# Patient Record
Sex: Female | Born: 1990 | Race: Black or African American | Hispanic: No | Marital: Single | Smoking: Current every day smoker
Health system: Southern US, Community
[De-identification: ages and names within clinical notes are randomized; demographics above are authoritative.]

## PROBLEM LIST (undated history)

## (undated) ENCOUNTER — Inpatient Hospital Stay (HOSPITAL_COMMUNITY): Payer: Self-pay

## (undated) DIAGNOSIS — Z8619 Personal history of other infectious and parasitic diseases: Secondary | ICD-10-CM

## (undated) DIAGNOSIS — F419 Anxiety disorder, unspecified: Secondary | ICD-10-CM

## (undated) DIAGNOSIS — K319 Disease of stomach and duodenum, unspecified: Secondary | ICD-10-CM

## (undated) DIAGNOSIS — B999 Unspecified infectious disease: Secondary | ICD-10-CM

## (undated) DIAGNOSIS — F329 Major depressive disorder, single episode, unspecified: Secondary | ICD-10-CM

## (undated) DIAGNOSIS — B379 Candidiasis, unspecified: Secondary | ICD-10-CM

## (undated) DIAGNOSIS — D649 Anemia, unspecified: Secondary | ICD-10-CM

## (undated) DIAGNOSIS — J189 Pneumonia, unspecified organism: Secondary | ICD-10-CM

## (undated) HISTORY — DX: Unspecified infectious disease: B99.9

## (undated) HISTORY — DX: Personal history of other infectious and parasitic diseases: Z86.19

## (undated) HISTORY — DX: Disease of stomach and duodenum, unspecified: K31.9

## (undated) HISTORY — DX: Pneumonia, unspecified organism: J18.9

## (undated) HISTORY — DX: Candidiasis, unspecified: B37.9

## (undated) HISTORY — DX: Anxiety disorder, unspecified: F41.9

---
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
## 2010-05-09 LAB — POCT URINALYSIS DIP (DEVICE)
Hgb urine dipstick: NEGATIVE
Nitrite: NEGATIVE
Protein, ur: NEGATIVE mg/dL
Urobilinogen, UA: 1 mg/dL (ref 0.0–1.0)

## 2010-05-09 LAB — POCT PREGNANCY, URINE: Preg Test, Ur: NEGATIVE

## 2010-05-09 LAB — URINE CULTURE: Culture: NO GROWTH

## 2010-11-05 LAB — CBC
Hemoglobin: 12.6
MCV: 86.6
RBC: 4.34
WBC: 4.3 — ABNORMAL LOW

## 2010-11-05 LAB — URINALYSIS, ROUTINE W REFLEX MICROSCOPIC
Bilirubin Urine: NEGATIVE
Glucose, UA: NEGATIVE
Specific Gravity, Urine: 1.022

## 2010-11-05 LAB — URINE MICROSCOPIC-ADD ON

## 2010-11-05 LAB — COMPREHENSIVE METABOLIC PANEL
BUN: 5 — ABNORMAL LOW
Creatinine, Ser: 0.67
Glucose, Bld: 87

## 2010-11-05 LAB — POCT PREGNANCY, URINE: Preg Test, Ur: NEGATIVE

## 2010-11-05 LAB — DIFFERENTIAL
Eosinophils Relative: 1
Lymphs Abs: 1.6
Monocytes Absolute: 0.5

## 2011-02-04 DIAGNOSIS — B999 Unspecified infectious disease: Secondary | ICD-10-CM

## 2011-02-04 HISTORY — DX: Unspecified infectious disease: B99.9

## 2011-07-15 ENCOUNTER — Inpatient Hospital Stay (HOSPITAL_COMMUNITY): Payer: Medicaid Other

## 2011-07-15 ENCOUNTER — Encounter (HOSPITAL_COMMUNITY): Payer: Self-pay | Admitting: *Deleted

## 2011-07-15 ENCOUNTER — Inpatient Hospital Stay (HOSPITAL_COMMUNITY)
Admission: AD | Admit: 2011-07-15 | Discharge: 2011-07-15 | Disposition: A | Discharge status: Home / Self Care | Payer: Medicaid Other | Source: Ambulatory Visit | Attending: Family Medicine | Admitting: Family Medicine

## 2011-07-15 DIAGNOSIS — O219 Vomiting of pregnancy, unspecified: Secondary | ICD-10-CM

## 2011-07-15 DIAGNOSIS — R109 Unspecified abdominal pain: Secondary | ICD-10-CM | POA: Insufficient documentation

## 2011-07-15 DIAGNOSIS — O210 Mild hyperemesis gravidarum: Secondary | ICD-10-CM | POA: Insufficient documentation

## 2011-07-15 LAB — CBC
MCH: 29.4 pg (ref 26.0–34.0)
Platelets: 369 10*3/uL (ref 150–400)
RDW: 12.1 % (ref 11.5–15.5)
WBC: 11.7 10*3/uL — ABNORMAL HIGH (ref 4.0–10.5)

## 2011-07-15 LAB — URINALYSIS, ROUTINE W REFLEX MICROSCOPIC
Glucose, UA: NEGATIVE mg/dL
Hgb urine dipstick: NEGATIVE
Protein, ur: NEGATIVE mg/dL
Specific Gravity, Urine: 1.015 (ref 1.005–1.030)
pH: 6.5 (ref 5.0–8.0)

## 2011-07-15 LAB — HCG, QUANTITATIVE, PREGNANCY: hCG, Beta Chain, Quant, S: 99386 m[IU]/mL — ABNORMAL HIGH (ref <5)

## 2011-07-15 LAB — URINE MICROSCOPIC-ADD ON

## 2011-07-15 LAB — ABO/RH: ABO/RH(D): O POS

## 2011-07-15 LAB — WET PREP, GENITAL
Clue Cells Wet Prep HPF POC: NONE SEEN
Trich, Wet Prep: NONE SEEN
Yeast Wet Prep HPF POC: NONE SEEN

## 2011-07-15 LAB — POCT PREGNANCY, URINE: Preg Test, Ur: POSITIVE — AB

## 2011-07-15 MED ORDER — PROMETHAZINE HCL 12.5 MG PO TABS: 12.5000 mg | ORAL_TABLET | Freq: Four times a day (QID) | ORAL | Status: DC | PRN

## 2011-07-15 NOTE — MAU Note (Signed)
Almost 8wks preg.  Last night had some cramping. Vomited around 2100 last night.  Since then has had little sharp pains.

## 2011-07-15 NOTE — Discharge Instructions (Signed)
Morning Sickness Morning sickness is when you feel sick to your stomach (nauseous) during pregnancy. This nauseous feeling may or may not come with throwing up (vomiting). It often occurs in the morning, but can be a problem any time of day. While morning sickness is unpleasant, it is usually harmless unless you develop severe and continual vomiting (hyperemesis gravidarum). This condition requires more intense treatment. CAUSES  The cause of morning sickness is not completely known but seems to be related to a sudden increase of two hormones:   Human chorionic gonadotropin (hCG).   Estrogen hormone.  These are elevated in the first part of the pregnancy. TREATMENT  Do not use any medicines (prescription, over-the-counter, or herbal) for morning sickness without first talking to your caregiver. Some patients are helped by the following:  Vitamin B6 (25mg every 8 hours) or vitamin B6 shots.   An antihistamine called doxylamine (10mg every 8 hours).   The herbal medication ginger.  HOME CARE INSTRUCTIONS   Taking multivitamins before getting pregnant can prevent or decrease the severity of morning sickness in most women.   Eat a piece of dry toast or unsalted crackers before getting out of bed in the morning.   Eat 5 or 6 small meals a day.   Eat dry and bland foods (rice, baked potato).   Do not drink liquids with your meals. Drink liquids between meals.   Avoid greasy, fatty, and spicy foods.   Get someone to cook for you if the smell of any food causes nausea and vomiting.   Avoid vitamin pills with iron because iron can cause nausea.   Snack on protein foods between meals if you are hungry.   Eat unsweetened gelatins for deserts.   Wear an acupressure wristband (worn for sea sickness) may be helpful.   Acupuncture may be helpful.   Do not smoke.   Get a humidifier to keep the air in your house free of odors.  SEEK MEDICAL CARE IF:   Your home remedies are not working  and you need medication.   You feel dizzy or lightheaded.   You are losing weight.   You need help with your diet.  SEEK IMMEDIATE MEDICAL CARE IF:   You have persistent and uncontrolled nausea and vomiting.   You pass out (faint).   You have a fever.  MAKE SURE YOU:   Understand these instructions.   Will watch your condition.   Will get help right away if you are not doing well or get worse.  Document Released: 03/13/2006 Document Revised: 01/09/2011 Document Reviewed: 01/08/2007 [employer] Patient Information 2012 [employer]. 

## 2011-07-15 NOTE — MAU Provider Note (Signed)
Gardiner Sleeper y.o.G1P0 @[redacted]w[redacted]d  by LMP Chief Complaint  Patient presents with  . Abdominal Pain     First Provider Initiated Contact with Patient 07/15/11 1650      SUBJECTIVE  HPI: She describes crampy lower abdominal pain "like when you ovulate" that has been present for one day. She's also had some sharp pains in her upper abdomen since she vomited past night. No vaginal bleeding. Has applied for [facility] and plans to get care at [facility].  Past Medical History  Diagnosis Date  . No pertinent past medical history    Past Surgical History  Procedure Date  . Tonsillectomy   . Wisdom tooth extraction 2007   History   Social History  . Marital Status: Married    Spouse Name: N/A    Number of Children: N/A  . Years of Education: N/A   Occupational History  . Not on file.   Social History Main Topics  . Smoking status: Former Games developer  . Smokeless tobacco: Not on file  . Alcohol Use: No     social alcohol before pregnancy  . Drug Use: No  . Sexually Active: Yes   Other Topics Concern  . Not on file   Social History Narrative  . No narrative on file   No current facility-administered medications on file prior to encounter.   No current outpatient prescriptions on file prior to encounter.   No Known Allergies  ROS: Pertinent items in HPI  OBJECTIVE Blood pressure 104/66, pulse 79, temperature 98.7 F (37.1 C), temperature source Oral, resp. rate 18, height 5' 1.5" (1.562 m), weight 50.803 kg (112 lb), last menstrual period 05/23/2011.  GENERAL: Well-developed, well-nourished female in no acute distress.  HEENT: Normocephalic, good dentition HEART: normal rate RESP: normal effort ABDOMEN: Soft, nontender EXTREMITIES: Nontender, no edema NEURO: Alert and oriented SPECULUM EXAM: NEFG, thick whitedischarge, no blood noted, cervix clean BIMANUAL: cervixlong, closed; uterus 6-8 wk size; no adnexal tenderness or masses   LAB RESULTS  Results for orders placed during  the hospital encounter of 07/15/11 (from the past 24 hour(s))  URINALYSIS, ROUTINE W REFLEX MICROSCOPIC     Status: Abnormal   Collection Time   07/15/11  4:00 PM      Component Value Range   Color, Urine YELLOW  YELLOW    APPearance CLEAR  CLEAR    Specific Gravity, Urine 1.015  1.005 - 1.030    pH 6.5  5.0 - 8.0    Glucose, UA NEGATIVE  NEGATIVE (mg/dL)   Hgb urine dipstick NEGATIVE  NEGATIVE    Bilirubin Urine NEGATIVE  NEGATIVE    Ketones, ur NEGATIVE  NEGATIVE (mg/dL)   Protein, ur NEGATIVE  NEGATIVE (mg/dL)   Urobilinogen, UA 0.2  0.0 - 1.0 (mg/dL)   Nitrite NEGATIVE  NEGATIVE    Leukocytes, UA SMALL (*) NEGATIVE   URINE MICROSCOPIC-ADD ON     Status: Abnormal   Collection Time   07/15/11  4:00 PM      Component Value Range   Squamous Epithelial / LPF MANY (*) RARE    WBC, UA 3-6  <3 (WBC/hpf)   RBC / HPF 0-2  <3 (RBC/hpf)   Bacteria, UA MANY (*) RARE    Urine-Other MUCOUS PRESENT    POCT PREGNANCY, URINE     Status: Abnormal   Collection Time   07/15/11  4:06 PM      Component Value Range   Preg Test, Ur POSITIVE (*) NEGATIVE     IMAGING  Viable IUP [redacted]w[redacted]d  ASSESSMENT Nausea vomiting of pregnancy, without dehydration  PLAN  Rx Phenergan, PNVs Keep appointment at [facility] for pregnancy care     Kai Railsback 07/15/2011 4:56 PM

## 2011-07-16 LAB — GC/CHLAMYDIA PROBE AMP, GENITAL: GC Probe Amp, Genital: NEGATIVE

## 2011-07-16 NOTE — MAU Provider Note (Signed)
Chart reviewed and agree with management and plan.  

## 2011-09-03 ENCOUNTER — Ambulatory Visit (INDEPENDENT_AMBULATORY_CARE_PROVIDER_SITE_OTHER): Payer: Medicaid Other | Admitting: Obstetrics and Gynecology

## 2011-09-03 DIAGNOSIS — Z331 Pregnant state, incidental: Secondary | ICD-10-CM

## 2011-09-03 LAB — POCT URINALYSIS DIPSTICK
Blood, UA: NEGATIVE
Glucose, UA: NEGATIVE
Nitrite, UA: NEGATIVE

## 2011-09-04 ENCOUNTER — Ambulatory Visit (INDEPENDENT_AMBULATORY_CARE_PROVIDER_SITE_OTHER): Payer: Medicaid Other | Admitting: Obstetrics and Gynecology

## 2011-09-04 ENCOUNTER — Encounter: Payer: Self-pay | Admitting: Obstetrics and Gynecology

## 2011-09-04 VITALS — BP 110/70 | Wt 129.0 lb

## 2011-09-04 DIAGNOSIS — R110 Nausea: Secondary | ICD-10-CM

## 2011-09-04 DIAGNOSIS — D649 Anemia, unspecified: Secondary | ICD-10-CM | POA: Insufficient documentation

## 2011-09-04 DIAGNOSIS — Z331 Pregnant state, incidental: Secondary | ICD-10-CM

## 2011-09-04 LAB — PRENATAL PANEL VII
Antibody Screen: NEGATIVE
Basophils Absolute: 0 10*3/uL (ref 0.0–0.1)
HCT: 31.3 % — ABNORMAL LOW (ref 36.0–46.0)
HIV: NONREACTIVE
Hemoglobin: 10.7 g/dL — ABNORMAL LOW (ref 12.0–15.0)
Lymphocytes Relative: 18 % (ref 12–46)
MCV: 87.2 fL (ref 78.0–100.0)
Neutro Abs: 9.8 10*3/uL — ABNORMAL HIGH (ref 1.7–7.7)
RDW: 13.9 % (ref 11.5–15.5)
Rh Type: POSITIVE

## 2011-09-04 LAB — POCT WET PREP (WET MOUNT)
Clue Cells Wet Prep Whiff POC: NEGATIVE
WBC, Wet Prep HPF POC: NEGATIVE

## 2011-09-04 MED ORDER — ONDANSETRON 4 MG PO TBDP: 4.0000 mg | ORAL_TABLET | Freq: Three times a day (TID) | ORAL | Status: AC | PRN

## 2011-09-04 NOTE — Progress Notes (Signed)
[redacted]w[redacted]d Subjective:    Diane Kent is being seen today for her first obstetrical visit at [redacted]w[redacted]d gestation by USS EDD 02/27/11.   She reports intermittent tiredness.  Her obstetrical history is significant for: There is no problem list on file for this patient.   Relationship with FOB:  Involved and supportive.  Patient does intend to breast feed.   Pregnancy history fully reviewed.    Review of Systems Pertinent ROS is described in HPI   Objective:   BP 110/70  Wt 129 lb (58.514 kg)  LMP 05/23/2011 Wt Readings from Last 1 Encounters:  09/04/11 129 lb (58.514 kg)   BMI: 20.82  General: alert, cooperative and no distress Respiratory: clear to auscultation bilaterally Cardiovascular: regular rate and rhythm, S1, S2 normal, no murmur Breasts:  No dominant masses, nipples erect Gastrointestinal: soft, non-tender; no masses,  no organomegaly Extremities: extremities normal, no pain or edema Vaginal Bleeding: None  EXTERNAL GENITALIA: normal appearing vulva with no masses, tenderness or lesions VAGINA: no abnormal discharge or lesions CERVIX: no lesions or cervical motion tenderness; cervix closed, long, firm UTERUS: gravid and consistent with [redacted]w[redacted]d weeks.   ADNEXA: no masses palpable and nontender OB EXAM PELVIMETRY: appears adequate   FHR:  145 bpm  Assessment:    Pregnancy at [redacted]w[redacted]d   Plan:     Prenatal panel reviewed and discussed with the patient:yes  Advised re anemia. Patient states that she has a hx of anemia. Pap smear collected:yes GC/Chlamydia collected:yes Wet prep:  Ph 4.0 - neg Discussion of Genetic testing options: would like to do Quad screen Prenatal vitamins recommended - taking same. Problem list reviewed and updated.  Plan of care: Follow up in 4 weeks for Anatomy USS - to schedule  Earl Gala CNM, [place] 09/04/2011 6:20 PM

## 2011-09-04 NOTE — Progress Notes (Signed)
Pap done feb/march 2013 wnl  Per pt.  No complaints.

## 2011-09-05 LAB — CULTURE, OB URINE: Colony Count: NO GROWTH

## 2011-09-08 LAB — PAP IG, CT-NG, RFX HPV ASCU

## 2011-10-02 ENCOUNTER — Other Ambulatory Visit: Payer: Self-pay | Admitting: Obstetrics and Gynecology

## 2011-10-02 ENCOUNTER — Encounter: Payer: Self-pay | Admitting: Obstetrics and Gynecology

## 2011-10-02 ENCOUNTER — Ambulatory Visit (INDEPENDENT_AMBULATORY_CARE_PROVIDER_SITE_OTHER): Payer: Medicaid Other

## 2011-10-02 ENCOUNTER — Ambulatory Visit (INDEPENDENT_AMBULATORY_CARE_PROVIDER_SITE_OTHER): Payer: Medicaid Other | Admitting: Obstetrics and Gynecology

## 2011-10-02 VITALS — BP 90/68 | Wt 136.0 lb

## 2011-10-02 DIAGNOSIS — Z331 Pregnant state, incidental: Secondary | ICD-10-CM

## 2011-10-02 DIAGNOSIS — Z3689 Encounter for other specified antenatal screening: Secondary | ICD-10-CM

## 2011-10-02 DIAGNOSIS — Z3490 Encounter for supervision of normal pregnancy, unspecified, unspecified trimester: Secondary | ICD-10-CM

## 2011-10-02 DIAGNOSIS — Z1379 Encounter for other screening for genetic and chromosomal anomalies: Secondary | ICD-10-CM

## 2011-10-02 LAB — US OB COMP + 14 WK

## 2011-10-02 NOTE — Addendum Note (Signed)
Addended by: Janeece Agee on: 10/02/2011 10:24 AM   Modules accepted: Orders

## 2011-10-02 NOTE — Progress Notes (Signed)
Doing well. [place] today--see CMA note for details.  EDC c/w dates Quad screen today.

## 2011-10-02 NOTE — Progress Notes (Signed)
WNL anatomy u/s today  Excited it's a girl! EFW 10 oz cx 3.79 Anterior placenta. Placenta edge to cx WNL's  AP pocket = 4.3 cm

## 2011-10-06 ENCOUNTER — Telehealth: Payer: Self-pay | Admitting: Obstetrics and Gynecology

## 2011-10-06 NOTE — Telephone Encounter (Signed)
Last bm 9/1. Discussed nutrition, low fat diet without fast food, encouraged clear liquids x 24 hours, OTC antidiarrhea meds prn, report if symptoms change, contiue, or unresolved. Lavera Guise, CNM

## 2011-10-07 ENCOUNTER — Telehealth: Payer: Self-pay

## 2011-10-07 DIAGNOSIS — Z331 Pregnant state, incidental: Secondary | ICD-10-CM

## 2011-10-07 LAB — AFP, QUAD SCREEN
Age Alone: 1:1160 {titer}
HCG, Total: 19485 m[IU]/mL
MoM for AFP: 0.9
MoM for hCG: 1.07
Open Spina bifida: NEGATIVE
Tri 18 Scr Risk Est: NEGATIVE
uE3 Mom: 0.83
uE3 Value: 0.9 ng/mL

## 2011-10-07 MED ORDER — VITAFOL-ONE 29-1-200 MG PO CAPS: 1.0000 | ORAL_CAPSULE | Freq: Every day | ORAL | Status: DC

## 2011-10-07 NOTE — Telephone Encounter (Signed)
Called pt to inform her rx she requested re: VitaFol One DHA have been sent to [facility]. Pt agrees and voices understands.

## 2011-10-07 NOTE — Telephone Encounter (Deleted)
Message copied by Janeece Agee on Tue Oct 07, 2011 12:00 PM ------      Message from: Cornelius Moras      Created: Fri Oct 03, 2011 12:21 PM      Regarding: PNV need       Patient wants "Vita Fol" PNV--can't order in EPIC.      Would you please, pretty please, with sugar on top, call this in for #30, refills x 1 year to the patient's desired pharmacy???            Love you, mean it!            VL

## 2011-10-30 ENCOUNTER — Encounter: Payer: Self-pay | Admitting: Obstetrics and Gynecology

## 2011-10-30 ENCOUNTER — Ambulatory Visit (INDEPENDENT_AMBULATORY_CARE_PROVIDER_SITE_OTHER): Payer: Medicaid Other | Admitting: Obstetrics and Gynecology

## 2011-10-30 VITALS — BP 104/60 | Wt 144.0 lb

## 2011-10-30 DIAGNOSIS — Z331 Pregnant state, incidental: Secondary | ICD-10-CM

## 2011-10-30 NOTE — Progress Notes (Signed)
Patient ID: Diane Kent, female   DOB: 1990/10/24, 21 y.o.   MRN: [id number] [redacted]w[redacted]d F/o 1 gtt Discussed common discomforts of pg, excessive weight gain, nutrition. Avoid sauces, gravy, sweet tea, soda, fried foods, limit eating out and watch food choices and portion sizes. No more than 1/2 cup juice daily, better to eat fruit then drink juice. Increase fiber in diet, fresh rather than processed foods. Get protein throught meals and snacks to include meat, eggs, beans, nuts skim an fat free dairy: milk, cheese, yougart, 8 glasses of water daily. Exercise discussed. Lavera Guise, CNM

## 2011-10-30 NOTE — Progress Notes (Signed)
[redacted]w[redacted]d No concerns per pt  WNL quad screen

## 2011-11-07 ENCOUNTER — Inpatient Hospital Stay (HOSPITAL_COMMUNITY)
Admission: AD | Admit: 2011-11-07 | Discharge: 2011-11-07 | Disposition: A | Discharge status: Home / Self Care | Payer: Medicaid Other | Source: Ambulatory Visit | Attending: Obstetrics and Gynecology | Admitting: Obstetrics and Gynecology

## 2011-11-07 ENCOUNTER — Telehealth: Payer: Self-pay | Admitting: Obstetrics and Gynecology

## 2011-11-07 ENCOUNTER — Encounter (HOSPITAL_COMMUNITY): Payer: Self-pay | Admitting: Obstetrics and Gynecology

## 2011-11-07 DIAGNOSIS — O4700 False labor before 37 completed weeks of gestation, unspecified trimester: Secondary | ICD-10-CM

## 2011-11-07 LAB — URINALYSIS, ROUTINE W REFLEX MICROSCOPIC
Bilirubin Urine: NEGATIVE
Glucose, UA: 100 mg/dL — AB
Hgb urine dipstick: NEGATIVE
Ketones, ur: NEGATIVE mg/dL
Protein, ur: NEGATIVE mg/dL

## 2011-11-07 LAB — WET PREP, GENITAL

## 2011-11-07 LAB — FETAL FIBRONECTIN: Fetal Fibronectin: NEGATIVE

## 2011-11-07 MED ORDER — LACTATED RINGERS IV BOLUS (SEPSIS)
500.0000 mL | Freq: Once | INTRAVENOUS | Status: AC
Start: 2011-11-07 — End: 2011-11-07
  Administered 2011-11-07: 500 mL via INTRAVENOUS

## 2011-11-07 MED ORDER — IBUPROFEN 800 MG PO TABS
800.0000 mg | ORAL_TABLET | Freq: Once | ORAL | Status: AC
  Administered 2011-11-07: 800 mg via ORAL
  Filled 2011-11-07: qty 1

## 2011-11-07 MED ORDER — IBUPROFEN 800 MG PO TABS: 800.0000 mg | ORAL_TABLET | Freq: Three times a day (TID) | ORAL | Status: DC | PRN

## 2011-11-07 NOTE — Telephone Encounter (Signed)
TC from pt. States the past few days has had increasing  of contractions sometimes 3-4/hr which are more painful than previously.  Has increased water but no change. Also has increased mucousy vaginal D/C with wiping. +FM  Continues to have lightheadedness even though eating more frequently.  Per DD pt to MAU. Pt verbalizes comprehension.

## 2011-11-07 NOTE — MAU Note (Signed)
Low abd pains - been getting worse since Wed, abd gets hard.  Has a discharge, yellowish tint- is a thick mucous.

## 2011-11-07 NOTE — MAU Provider Note (Signed)
History   This patient called the office with complaint of preterm contractions and change in vaginal discharge.  Reports intercourse 2 nights ago.  No VB or LOF.  GFM.  No UTI or PIH s/s.  Pt not in school and unemployed.    CSN: [id number]  Arrival date and time: 11/07/11 1518   First Provider Initiated Contact with Patient 11/07/11 1900      Chief Complaint  Patient presents with  . Labor Eval   HPI  OB History    Grav Para Term Preterm Abortions TAB SAB Ect Mult Living   1 0 0 0 0 0 0 0 0 0       Past Medical History  Diagnosis Date  . Pneumonia     AS CHILD  . Stomach disease 2009    NO MEDS CURRENTLY  . Anxiety 2009    ON MEDS X 1 YEAR  . Infection 02/2011    UTI  . Yeast infection   . H/O bacterial infection   . History of chlamydia     Past Surgical History  Procedure Date  . Wisdom tooth extraction 2007  . Tonsillectomy 2009    T&A    Family History  Problem Relation Age of Onset  . Lupus Maternal Aunt   . Hypertension Paternal Aunt   . Fibromyalgia Paternal Aunt   . Mental illness Paternal Uncle   . Hypertension Paternal Uncle   . Arthritis Maternal Grandmother   . Hyperlipidemia Maternal Grandmother   . Hypertension Maternal Grandmother   . Hyperlipidemia Paternal Grandmother   . Hypertension Paternal Grandmother   . Diabetes Paternal Grandfather   . Hyperlipidemia Paternal Grandfather   . Hypertension Paternal Grandfather   . Asthma Other     History  Substance Use Topics  . Smoking status: Former Smoker -- 1.0 packs/day    Quit date: 06/28/2011  . Smokeless tobacco: Never Used  . Alcohol Use: No     social alcohol before pregnancy    Allergies: No Known Allergies  Prescriptions prior to admission  Medication Sig Dispense Refill  . calcium carbonate (TUMS EX) 750 MG chewable tablet Chew 1 tablet by mouth daily. heartburn      . ondansetron (ZOFRAN-ODT) 8 MG disintegrating tablet Take 8 mg by mouth every 8 (eight) hours as needed.       . Prenatal Vit-FePoly-FA-DHA (VITAFOL-ONE) 29-1-200 MG CAPS Take 1 capsule by mouth daily.  30 capsule  11  . DISCONTD: Prenatal Vit-Fe Fumarate-FA (PRENATAL MULTIVITAMIN) TABS Take 1 tablet by mouth daily.      . promethazine (PHENERGAN) 12.5 MG tablet Take 1 tablet (12.5 mg total) by mouth every 6 (six) hours as needed for nausea.  30 tablet  0    ROS--negative for all systems Physical Exam   Blood pressure 124/77, pulse 104, temperature 98.1 F (36.7 C), temperature source Oral, resp. rate 20, height 5\' 2"  (1.575 m), weight 148 lb (67.132 kg), last menstrual period 05/23/2011.  Physical Exam Constitutional: She is oriented to person, place, and time. She appears well-developed and well-nourished.  HENT:  Head: Normocephalic and atraumatic.  Eyes: Conjunctivae normal and EOM are normal. Pupils are equal, round, and reactive to light.  Neck: Normal range of motion. Neck supple.  Cardiovascular: Normal rate, regular rhythm and normal heart sounds.  Respiratory: Effort normal and breath sounds normal.  GI: Soft. Bowel sounds are normal.  Genitourinary: Uterus normal. Vaginal discharge found.  Preterm contractions Creamy, yellow discharge.  Musculoskeletal: Normal range of  motion.  Neurological: She is alert and oriented to person, place, and time. She has normal reflexes.  Skin: Skin is warm and dry.  Psychiatric: She has a normal mood and affect.   .. Results for orders placed during the hospital encounter of 11/07/11 (from the past 24 hour(s))  URINALYSIS, ROUTINE W REFLEX MICROSCOPIC     Status: Abnormal   Collection Time   11/07/11  3:47 PM      Component Value Range   Color, Urine YELLOW  YELLOW   APPearance CLEAR  CLEAR   Specific Gravity, Urine >1.030 (*) 1.005 - 1.030   pH 6.0  5.0 - 8.0   Glucose, UA 100 (*) NEGATIVE mg/dL   Hgb urine dipstick NEGATIVE  NEGATIVE   Bilirubin Urine NEGATIVE  NEGATIVE   Ketones, ur NEGATIVE  NEGATIVE mg/dL   Protein, ur NEGATIVE   NEGATIVE mg/dL   Urobilinogen, UA 0.2  0.0 - 1.0 mg/dL   Nitrite NEGATIVE  NEGATIVE   Leukocytes, UA NEGATIVE  NEGATIVE  WET PREP, GENITAL     Status: Abnormal   Collection Time   11/07/11  6:40 PM      Component Value Range   Yeast Wet Prep HPF POC NONE SEEN  NONE SEEN   Trich, Wet Prep NONE SEEN  NONE SEEN   Clue Cells Wet Prep HPF POC NONE SEEN  NONE SEEN   WBC, Wet Prep HPF POC FEW (*) NONE SEEN  FETAL FIBRONECTIN     Status: Normal   Collection Time   11/07/11  6:40 PM      Component Value Range   Fetal Fibronectin NEGATIVE  NEGATIVE  EFM: 140, appropriate for GA TOCO; occ'l ctx w/ some UI, spaced after IV hydration  MAU Course  Procedures Gc/ct FFN Wet prep U/a One liter LR Motrin 800mg  po x1 Assessment and Plan  1.  [redacted]w[redacted]d 2.  Preterm contractions w/ some spacing after IV hydration 3.  Inadequate po hydration today 3.  No s/s of infection 4.  FFN negative & cx closed  1.  D/c'd home after interventions w/ PTL precautions 2.  Motrin Rx given to continue x24hrs every 8 hrs po; reiterated adequate water intake 3.  F/u 10/24, or prn.  Will have office call pt first of week to check on her; no strenuous activity this weekend and pelvic rest for at least one week  Diane Kent H 11/07/2011, 8:45 PM

## 2011-11-08 LAB — GC/CHLAMYDIA PROBE AMP, GENITAL: GC Probe Amp, Genital: NEGATIVE

## 2011-11-24 ENCOUNTER — Telehealth: Payer: Self-pay | Admitting: Obstetrics and Gynecology

## 2011-11-24 NOTE — Telephone Encounter (Signed)
TC to pt. States has had nasal congestion , sore throat, headaches, body aches and cough x 1 week. T-nl. Has tried Robitussin and Tylenol with no improvement. Also states felt FM this AM.  None this afternoon after eating lunch and drinking GAtorade. Per VL, advised pt to try Delsym for her cough and Sudafed for congestion.  To continue rest and increased fluids. Also per VL, pt to eat, drink sweet fluid and monitor FM.   To call VL by 7:00 Pm with update. Instructions given to reach after hours. Pt verbalizes comprehension.

## 2011-11-25 ENCOUNTER — Telehealth: Payer: Self-pay | Admitting: Obstetrics and Gynecology

## 2011-11-25 NOTE — Telephone Encounter (Signed)
VM received from "general delivery."   States was supposed to call regarding FM and baby has been moving more.   TC to pt.   States unable to reach midwife last PM. Baby started moving after eating last PM.  Advised to call if unsure of decreased FM or no improvement in cold sx.  Pt verbalizes comprehension.

## 2011-11-27 ENCOUNTER — Other Ambulatory Visit: Payer: Medicaid Other

## 2011-11-27 ENCOUNTER — Ambulatory Visit (INDEPENDENT_AMBULATORY_CARE_PROVIDER_SITE_OTHER): Payer: Medicaid Other | Admitting: Obstetrics and Gynecology

## 2011-11-27 ENCOUNTER — Encounter: Payer: Self-pay | Admitting: Obstetrics and Gynecology

## 2011-11-27 VITALS — BP 92/60 | Temp 98.0°F | Wt 143.0 lb

## 2011-11-27 DIAGNOSIS — H6690 Otitis media, unspecified, unspecified ear: Secondary | ICD-10-CM

## 2011-11-27 DIAGNOSIS — Z331 Pregnant state, incidental: Secondary | ICD-10-CM

## 2011-11-27 MED ORDER — AZITHROMYCIN 250 MG PO TABS: ORAL_TABLET | ORAL | Status: DC

## 2011-11-27 NOTE — Progress Notes (Signed)
[redacted]w[redacted]d Pt c/o L side ear pain since this am  Pt c/o cold sx's / WIll get flu vaccine once cold sx's has subsided  Pt has Peds Dr. Tally Joe 1 GTT given today Pt plans for Nexplanon Birth classes brochure given

## 2011-11-27 NOTE — Patient Instructions (Signed)
Fetal Movement Counts Patient Name: __________________________________________________ Patient Due Date: ____________________ Kick counts is highly recommended in high risk pregnancies, but it is a good idea for every pregnant woman to do. Start counting fetal movements at 28 weeks of the pregnancy. Fetal movements increase after eating a full meal or eating or drinking something sweet (the blood sugar is higher). It is also important to drink plenty of fluids (well hydrated) before doing the count. Lie on your left side because it helps with the circulation or you can sit in a comfortable chair with your arms over your belly (abdomen) with no distractions around you. DOING THE COUNT  Try to do the count the same time of day each time you do it.  Mark the day and time, then see how long it takes for you to feel 10 movements (kicks, flutters, swishes, rolls). You should have at least 10 movements within 2 hours. You will most likely feel 10 movements in much less than 2 hours. If you do not, wait an hour and count again. After a couple of days you will see a pattern.  What you are looking for is a change in the pattern or not enough counts in 2 hours. Is it taking longer in time to reach 10 movements? SEEK MEDICAL CARE IF:  You feel less than 10 counts in 2 hours. Tried twice.  No movement in one hour.  The pattern is changing or taking longer each day to reach 10 counts in 2 hours.  You feel the baby is not moving as it usually does. Date: ____________ Movements: ____________ Start time: ____________ Finish time: ____________  Date: ____________ Movements: ____________ Start time: ____________ Finish time: ____________ Date: ____________ Movements: ____________ Start time: ____________ Finish time: ____________ Date: ____________ Movements: ____________ Start time: ____________ Finish time: ____________ Date: ____________ Movements: ____________ Start time: ____________ Finish time:  ____________ Date: ____________ Movements: ____________ Start time: ____________ Finish time: ____________ Date: ____________ Movements: ____________ Start time: ____________ Finish time: ____________ Date: ____________ Movements: ____________ Start time: ____________ Finish time: ____________  Date: ____________ Movements: ____________ Start time: ____________ Finish time: ____________ Date: ____________ Movements: ____________ Start time: ____________ Finish time: ____________ Date: ____________ Movements: ____________ Start time: ____________ Finish time: ____________ Date: ____________ Movements: ____________ Start time: ____________ Finish time: ____________ Date: ____________ Movements: ____________ Start time: ____________ Finish time: ____________ Date: ____________ Movements: ____________ Start time: ____________ Finish time: ____________ Date: ____________ Movements: ____________ Start time: ____________ Finish time: ____________  Date: ____________ Movements: ____________ Start time: ____________ Finish time: ____________ Date: ____________ Movements: ____________ Start time: ____________ Finish time: ____________ Date: ____________ Movements: ____________ Start time: ____________ Finish time: ____________ Date: ____________ Movements: ____________ Start time: ____________ Finish time: ____________ Date: ____________ Movements: ____________ Start time: ____________ Finish time: ____________ Date: ____________ Movements: ____________ Start time: ____________ Finish time: ____________ Date: ____________ Movements: ____________ Start time: ____________ Finish time: ____________  Date: ____________ Movements: ____________ Start time: ____________ Finish time: ____________ Date: ____________ Movements: ____________ Start time: ____________ Finish time: ____________ Date: ____________ Movements: ____________ Start time: ____________ Finish time: ____________ Date: ____________ Movements:  ____________ Start time: ____________ Finish time: ____________ Date: ____________ Movements: ____________ Start time: ____________ Finish time: ____________ Date: ____________ Movements: ____________ Start time: ____________ Finish time: ____________ Date: ____________ Movements: ____________ Start time: ____________ Finish time: ____________  Date: ____________ Movements: ____________ Start time: ____________ Finish time: ____________ Date: ____________ Movements: ____________ Start time: ____________ Finish time: ____________ Date: ____________ Movements: ____________ Start time: ____________ Finish time: ____________ Date: ____________ Movements:   ____________ Start time: ____________ Doreatha Martin time: ____________ Date: ____________ Movements: ____________ Start time: ____________ Doreatha Martin time: ____________ Date: ____________ Movements: ____________ Start time: ____________ Doreatha Martin time: ____________ Date: ____________ Movements: ____________ Start time: ____________ Doreatha Martin time: ____________  Date: ____________ Movements: ____________ Start time: ____________ Doreatha Martin time: ____________ Date: ____________ Movements: ____________ Start time: ____________ Doreatha Martin time: ____________ Date: ____________ Movements: ____________ Start time: ____________ Doreatha Martin time: ____________ Date: ____________ Movements: ____________ Start time: ____________ Doreatha Martin time: ____________ Date: ____________ Movements: ____________ Start time: ____________ Doreatha Martin time: ____________ Date: ____________ Movements: ____________ Start time: ____________ Doreatha Martin time: ____________ Date: ____________ Movements: ____________ Start time: ____________ Doreatha Martin time: ____________  Date: ____________ Movements: ____________ Start time: ____________ Doreatha Martin time: ____________ Date: ____________ Movements: ____________ Start time: ____________ Doreatha Martin time: ____________ Date: ____________ Movements: ____________ Start time: ____________ Doreatha Martin  time: ____________ Date: ____________ Movements: ____________ Start time: ____________ Doreatha Martin time: ____________ Date: ____________ Movements: ____________ Start time: ____________ Doreatha Martin time: ____________ Date: ____________ Movements: ____________ Start time: ____________ Doreatha Martin time: ____________ Date: ____________ Movements: ____________ Start time: ____________ Doreatha Martin time: ____________  Date: ____________ Movements: ____________ Start time: ____________ Doreatha Martin time: ____________ Date: ____________ Movements: ____________ Start time: ____________ Doreatha Martin time: ____________ Date: ____________ Movements: ____________ Start time: ____________ Doreatha Martin time: ____________ Date: ____________ Movements: ____________ Start time: ____________ Doreatha Martin time: ____________ Date: ____________ Movements: ____________ Start time: ____________ Doreatha Martin time: ____________ Date: ____________ Movements: ____________ Start time: ____________ Doreatha Martin time: ____________ Document Released: 02/19/2006 Document Revised: 04/14/2011 Document Reviewed: 08/22/2008 [employer] Patient Information 2013 [employer].   Preterm Birth Preterm birth is a birth which happens before 37 weeks of pregnancy. Most pregnancies last about 39 to 41 weeks. Every week in the womb is important and is beneficial to the health of the infant. Infants born before 37 weeks of pregnancy are at a higher risk for complications. Depending on when the infant was born, he or she may be:  Late preterm. Born between 32 and 37 weeks of pregnancy.  Very preterm. Born at less than 32 weeks of pregnancy.  Extremely preterm. Born at less than 25 weeks of pregnancy. The earlier a baby is born, the more likely the child will have issues related to prematurity. Complications and problems that can be seen in infants born too early include:  Problems breathing (respiratory distress syndrome).  Low birth weight.  Problems feeding.  Sleeping  problems.  Yellowing of the skin (jaundice).  Infections such as pneumonia. Babies that are born very preterm or extremely preterm are at risk for more serious medical issues. These include:  Breathing issues.  Eyesight issues.  Brain development issues (intraventricular hemorrhage).  Behavioral and emotional development issues.  Growth and developmental delays.  Cerebral palsy.  Serious feeding or bowel complications (necrotizing enterocolitis). CAUSES  There are 2 broad categories of preterm birth.  Spontaneous preterm birth. This is a birth resulting from preterm labor (not medically induced) or preterm premature rupture of membranes (PPROM).  Indicated preterm birth. This is a birth resulting from labor being medically induced due to health, personal, or social reasons. Preterm birth may be related to certain medical conditions, lifestyle factors, or demographic factors encountered by the mother or fetus.   Medical conditions include:  Multiple gestations (twins, triplets, and so on).  Infection.  Diabetes.  Heart disease.  Kidney disease.  Cervical or uterine abnormalities.  Being underweight.  High blood pressure or preeclampsia.  Premature rupture of membranes (PROM).  Birth defects in the fetus.  Lifestyle factors include:  Poor prenatal care.  Poor nutrition or  anemia.  Cigarette smoking.  Consuming alcohol.  High levels of stress and lack of social or emotional support.  Exposure to chemical or environmental toxins.  Substance abuse.  Demographic factors include:  African-American ethnicity.  Age (younger than 69 or older than 31).  Low socioeconomic status. Women with a history of preterm labor or who become pregnant within 13 months of giving birth are also at increased risk for preterm birth. DIAGNOSIS  Your caregiver may request additional tests to diagnose underlying complications resulting from preterm birth. Tests on the  infant may include:  Physical exam.  Blood tests.  Chest X-rays.  Heart-lung monitoring. PREVENTION There are some things you can do to help lower your risk of having a preterm infant in the future. These include:  Good prenatal care throughout the entire pregnancy. See a caregiver regularly for advice and tests.  Management of underlying medical conditions.  Proper self-care and lifestyle changes.  Proper diet and weight control.  Watching for signs of various infections. TREATMENT  After birth, special care will be taken to assess any problems or complications of the infant. Supportive care will be provided for the infant. Treatment depends on what problems are present and any complications that develop. Some preterm infants are cared for in a neonatal intensive care unit. In general, care may include:  Maintaining temperature and oxygen in a clear heated box (baby isolette).  Monitoring the infant's heart rate, breathing, and level of oxygen in the blood.  Monitoring for signs of infection and, if needed, giving intravenous (IV) antibiotic medicine.  Inserting a feeding tube (nose, mouth) or giving IV nutrition if unable to feed.  Inserting a breathing tube (ventilation).  Respiration support (continuous positive airway pressure [CPAP] or oxygen). Treatment will change as the infant builds up strength and is able to breathe and eat on his or her own. For some infants, no special treatment is necessary. Parents may be educated on the potential health risks of prematurity to the infant. HOME CARE INSTRUCTIONS  Understand your infant's special conditions and needs. It may be reassuring to learn about infant CPR.  Monitor your infant in the car seat until he or she grows and matures. Infant car seats can cause breathing difficulties for preterm infants.  Keep your infant warm. Dress your infant in layers and keep him or her away from drafts, especially in cold months of the  year.  Wash your hands thoroughly after going to the bathroom or changing a diaper. Late preterm infants may be more prone to infection.  Follow all your caregiver's instructions for providing support and care to your preterm infant.  Get support from organizations and groups that understand your challenges.  Follow up with your infant's caregiver as directed. SEEK MEDICAL CARE IF:  Your infant has feeding difficulties.  Your infant has sleeping difficulties.  Your infant has breathing difficulties.  Your infant's skin starts to look yellow (jaundice).  Your infant shows signs of infection like a stuffy nose, fever, crying, or bluish color of the skin. FOR MORE INFORMATION [employer]: www.marchofdimes.com Prematurity.org: www.prematurity.org Document Released: 04/12/2003 Document Revised: 04/14/2011 Document Reviewed: 06/14/2009 [facility] Patient Information 2013 [place], [place].   Otitis Media, Adult A middle ear infection is an infection in the space behind the eardrum. The medical name for this is "otitis media." It may happen after a common cold. It is caused by a germ that starts growing in that space. You may feel swollen glands in your neck on the  side of the ear infection. HOME CARE INSTRUCTIONS   Take your medicine as directed until it is gone, even if you feel better after the first few days.  Only take over-the-counter or prescription medicines for pain, discomfort, or fever as directed by your caregiver.  Occasional use of a nasal decongestant a couple times per day may help with discomfort and help the eustachian tube to drain better. Follow up with your caregiver in 10 to 14 days or as directed, to be certain that the infection has cleared. Not keeping the appointment could result in a chronic or permanent injury, pain, hearing loss and disability. If there is any problem keeping the appointment, you must call back to this facility for assistance. SEEK IMMEDIATE  MEDICAL CARE IF:   You are not getting better in 2 to 3 days.  You have pain that is not controlled with medication.  You feel worse instead of better.  You cannot use the medication as directed.  You develop swelling, redness or pain around the ear or stiffness in your neck. MAKE SURE YOU:   Understand these instructions.  Will watch your condition.  Will get help right away if you are not doing well or get worse. Document Released: 10/26/2003 Document Revised: 04/14/2011 Document Reviewed: 08/27/2007 [employer] Patient Information 2013 [place], [place].

## 2011-11-27 NOTE — Progress Notes (Signed)
[redacted]w[redacted]d Head congestion and cough x1wk No fever, aches or chills, no N/V,  Woke up today w L earache  Seen at [facility] on 10-4 for PTC Denies ctx or cramping now Took BF classes rec CBE  1hr gtt today  Nasal turbinates erythematous and edematous  Throat clear R ear TM red L ear TM red, slightly swolen  URI   Rx zpack rec sudafed, benadryl, mucinex netti pot, nasal spray  Contact prim care or urgent care if sx's worsen or persistent fever  rv'd preterm labor [facility] RTO 2wks

## 2011-11-28 LAB — RPR

## 2011-12-12 ENCOUNTER — Ambulatory Visit (INDEPENDENT_AMBULATORY_CARE_PROVIDER_SITE_OTHER): Payer: Medicaid Other | Admitting: Obstetrics and Gynecology

## 2011-12-12 VITALS — BP 100/62 | Wt 159.0 lb

## 2011-12-12 DIAGNOSIS — Z3490 Encounter for supervision of normal pregnancy, unspecified, unspecified trimester: Secondary | ICD-10-CM

## 2011-12-12 DIAGNOSIS — Z331 Pregnant state, incidental: Secondary | ICD-10-CM

## 2011-12-12 DIAGNOSIS — Z23 Encounter for immunization: Secondary | ICD-10-CM

## 2011-12-12 NOTE — Progress Notes (Signed)
[redacted]w[redacted]d Office Visit on 11/27/2011  Component Date Value Range Status  . RPR 11/27/2011 NON REAC  NON REAC Final  . Glucose, 1 Hour GTT 11/27/2011 99  70 - 140 mg/dL Final  . Hemoglobin 16/11/9602 10.8* 12.0 - 15.0 g/dL Final   No complaints Several questions answered FKCs RTO 2wks Discussed wt

## 2011-12-13 ENCOUNTER — Encounter (HOSPITAL_COMMUNITY): Payer: Self-pay | Admitting: Obstetrics and Gynecology

## 2011-12-13 ENCOUNTER — Inpatient Hospital Stay (HOSPITAL_COMMUNITY)
Admission: AD | Admit: 2011-12-13 | Discharge: 2011-12-13 | Disposition: A | Discharge status: Home / Self Care | Payer: Medicaid Other | Source: Ambulatory Visit | Attending: Obstetrics and Gynecology | Admitting: Obstetrics and Gynecology

## 2011-12-13 ENCOUNTER — Telehealth: Payer: Self-pay | Admitting: Obstetrics and Gynecology

## 2011-12-13 DIAGNOSIS — O4690 Antepartum hemorrhage, unspecified, unspecified trimester: Secondary | ICD-10-CM | POA: Insufficient documentation

## 2011-12-13 DIAGNOSIS — O368190 Decreased fetal movements, unspecified trimester, not applicable or unspecified: Secondary | ICD-10-CM | POA: Insufficient documentation

## 2011-12-13 DIAGNOSIS — R109 Unspecified abdominal pain: Secondary | ICD-10-CM | POA: Insufficient documentation

## 2011-12-13 DIAGNOSIS — N930 Postcoital and contact bleeding: Secondary | ICD-10-CM

## 2011-12-13 LAB — URINALYSIS, ROUTINE W REFLEX MICROSCOPIC
Bilirubin Urine: NEGATIVE
Glucose, UA: NEGATIVE mg/dL
Hgb urine dipstick: NEGATIVE
Ketones, ur: NEGATIVE mg/dL
Protein, ur: NEGATIVE mg/dL

## 2011-12-13 NOTE — [facility]U Note (Signed)
Patient states she had spotting after intercourse last night, but none since that time. Has been having lower abdominal pressure and reports less fetal movement than usual.

## 2011-12-13 NOTE — Telephone Encounter (Signed)
Discussed comfort measures warm bath with baking soda, tylenol. Reviewed s/s preterm labor, srom, vag bleeding,daily kick counts to report, encouraged 8 water daily and frequent voids. Lavera Guise, CNM

## 2011-12-13 NOTE — Progress Notes (Signed)
C/o of decreased fm, vag bleeding with wiping this am x 1 after intercourse none since, now with +FM, no srom, no uc See prior telephone call O VSS      fhts reactive NST 140s      uc rare      Vag not assessed A [redacted]w[redacted]d     Reassuring FHTS P discharge. Reviewed s/s preterm labor, srom, vag bleeding,daily kick counts to report, encouraged 8 water daily and frequent voids. Lavera Guise, CNM

## 2011-12-25 ENCOUNTER — Ambulatory Visit (INDEPENDENT_AMBULATORY_CARE_PROVIDER_SITE_OTHER): Payer: Medicaid Other | Admitting: Obstetrics and Gynecology

## 2011-12-25 VITALS — BP 110/60 | Wt 163.0 lb

## 2011-12-25 DIAGNOSIS — Z331 Pregnant state, incidental: Secondary | ICD-10-CM

## 2011-12-25 NOTE — Progress Notes (Signed)
[redacted]w[redacted]d Doing well. Glucola equals 99.  Hemoglobin 10.8.  RPR nonreactive. Iron therapy and nutrition discussed. Car seat, childbirth classes, infant CPR, pediatrician discussed. Return office in 2 weeks. Dr. Stefano Gaul

## 2011-12-25 NOTE — Progress Notes (Signed)
[redacted]w[redacted]d No complaints today. 

## 2011-12-26 MED ORDER — VITAFOL-ONE 29-1-200 MG PO CAPS: 1.0000 | ORAL_CAPSULE | Freq: Every day | ORAL | Status: DC

## 2011-12-26 NOTE — Addendum Note (Signed)
Addended by: Janine Limbo on: 12/26/2011 06:18 PM   Modules accepted: Orders

## 2012-01-04 ENCOUNTER — Telehealth (HOSPITAL_COMMUNITY): Payer: Self-pay | Admitting: Obstetrics and Gynecology

## 2012-01-04 NOTE — Telephone Encounter (Signed)
C/O constipation with no BM for approx 7 days.  Has not treated with any meds.  C/O abd bloating and increased gas.  Only med at present is PNV.  Active fetus.  Denies UCs, ROM or bldg.  Rec fleets enema for immediate relief but pt states she is OOT on an Child psychotherapist and all pharmacies are closed.  Rec hot beverage for tonight.  Rec stool softeners, increased fiber, increased activity and Miralax or Senokot as needed.  F/U as sched for OB visit on 01/06/12.

## 2012-01-06 ENCOUNTER — Encounter: Payer: Self-pay | Admitting: Obstetrics and Gynecology

## 2012-01-06 ENCOUNTER — Ambulatory Visit (INDEPENDENT_AMBULATORY_CARE_PROVIDER_SITE_OTHER): Payer: Medicaid Other | Admitting: Obstetrics and Gynecology

## 2012-01-06 VITALS — BP 94/50 | Wt 164.0 lb

## 2012-01-06 DIAGNOSIS — Z331 Pregnant state, incidental: Secondary | ICD-10-CM

## 2012-01-06 NOTE — Progress Notes (Signed)
[redacted]w[redacted]d Pt has no complaints  

## 2012-01-06 NOTE — Progress Notes (Signed)
[redacted]w[redacted]d No complaints

## 2012-01-15 ENCOUNTER — Other Ambulatory Visit: Payer: Self-pay | Admitting: Obstetrics and Gynecology

## 2012-01-15 ENCOUNTER — Telehealth: Payer: Self-pay | Admitting: Obstetrics and Gynecology

## 2012-01-15 NOTE — Telephone Encounter (Signed)
TC from patient--34 weeks, G1P0, contracting q 5-7 min for 2 hours. Has pushed fluids and rested.  Some contractions are moderate. Denies leaking or bleeding, reports +FM. No hx PTL or any issues.  Come to MAU.

## 2012-01-16 ENCOUNTER — Observation Stay (HOSPITAL_COMMUNITY): Payer: Medicaid Other

## 2012-01-16 ENCOUNTER — Encounter (HOSPITAL_COMMUNITY): Payer: Self-pay | Admitting: *Deleted

## 2012-01-16 ENCOUNTER — Observation Stay (HOSPITAL_COMMUNITY)
Admission: AD | Admit: 2012-01-16 | Discharge: 2012-01-17 | Disposition: A | Discharge status: Home / Self Care | Payer: Medicaid Other | Source: Ambulatory Visit | Attending: Obstetrics and Gynecology | Admitting: Obstetrics and Gynecology

## 2012-01-16 DIAGNOSIS — O4700 False labor before 37 completed weeks of gestation, unspecified trimester: Principal | ICD-10-CM | POA: Insufficient documentation

## 2012-01-16 DIAGNOSIS — O99019 Anemia complicating pregnancy, unspecified trimester: Secondary | ICD-10-CM | POA: Insufficient documentation

## 2012-01-16 DIAGNOSIS — D649 Anemia, unspecified: Secondary | ICD-10-CM | POA: Insufficient documentation

## 2012-01-16 DIAGNOSIS — D72829 Elevated white blood cell count, unspecified: Secondary | ICD-10-CM | POA: Insufficient documentation

## 2012-01-16 LAB — DIFFERENTIAL
Basophils Absolute: 0 10*3/uL (ref 0.0–0.1)
Eosinophils Relative: 0 % (ref 0–5)
Lymphocytes Relative: 11 % — ABNORMAL LOW (ref 12–46)
Lymphs Abs: 2.1 10*3/uL (ref 0.7–4.0)
Neutro Abs: 15.3 10*3/uL — ABNORMAL HIGH (ref 1.7–7.7)

## 2012-01-16 LAB — URINALYSIS, ROUTINE W REFLEX MICROSCOPIC
Leukocytes, UA: NEGATIVE
Protein, ur: NEGATIVE mg/dL
Urobilinogen, UA: 0.2 mg/dL (ref 0.0–1.0)

## 2012-01-16 LAB — CBC
MCHC: 33.1 g/dL (ref 30.0–36.0)
MCV: 87.3 fL (ref 78.0–100.0)
Platelets: 421 10*3/uL — ABNORMAL HIGH (ref 150–400)
RDW: 12.9 % (ref 11.5–15.5)
WBC: 18.8 10*3/uL — ABNORMAL HIGH (ref 4.0–10.5)

## 2012-01-16 LAB — GC/CHLAMYDIA PROBE AMP
CT Probe RNA: NEGATIVE
GC Probe RNA: NEGATIVE

## 2012-01-16 LAB — WET PREP, GENITAL: Yeast Wet Prep HPF POC: NONE SEEN

## 2012-01-16 LAB — OB RESULTS CONSOLE GBS: GBS: NEGATIVE

## 2012-01-16 MED ORDER — BETAMETHASONE SOD PHOS & ACET 6 (3-3) MG/ML IJ SUSP
12.0000 mg | Freq: Once | INTRAMUSCULAR | Status: AC
  Administered 2012-01-16: 12 mg via INTRAMUSCULAR
  Filled 2012-01-16: qty 2

## 2012-01-16 MED ORDER — NIFEDIPINE 10 MG PO CAPS
10.0000 mg | ORAL_CAPSULE | ORAL | Status: AC | PRN
  Administered 2012-01-16 (×4): 10 mg via ORAL
  Filled 2012-01-16 (×4): qty 1

## 2012-01-16 MED ORDER — ACETAMINOPHEN 325 MG PO TABS
650.0000 mg | ORAL_TABLET | ORAL | Status: DC | PRN
  Administered 2012-01-16 (×2): 650 mg via ORAL
  Filled 2012-01-16 (×2): qty 2

## 2012-01-16 MED ORDER — DOCUSATE SODIUM 100 MG PO CAPS
100.0000 mg | ORAL_CAPSULE | Freq: Every day | ORAL | Status: DC
  Administered 2012-01-16 – 2012-01-17 (×2): 100 mg via ORAL
  Filled 2012-01-16 (×2): qty 1

## 2012-01-16 MED ORDER — CALCIUM CARBONATE ANTACID 500 MG PO CHEW: 2.0000 | CHEWABLE_TABLET | ORAL | Status: DC | PRN

## 2012-01-16 MED ORDER — BETAMETHASONE SOD PHOS & ACET 6 (3-3) MG/ML IJ SUSP: 12.0000 mg | INTRAMUSCULAR | Status: DC

## 2012-01-16 MED ORDER — BETAMETHASONE SOD PHOS & ACET 6 (3-3) MG/ML IJ SUSP
12.0000 mg | INTRAMUSCULAR | Status: DC
  Administered 2012-01-16: 12 mg via INTRAMUSCULAR
  Filled 2012-01-16 (×2): qty 2

## 2012-01-16 MED ORDER — TERBUTALINE SULFATE 1 MG/ML IJ SOLN
0.2500 mg | INTRAMUSCULAR | Status: AC
  Administered 2012-01-16: 0.25 mg via SUBCUTANEOUS
  Filled 2012-01-16: qty 1

## 2012-01-16 MED ORDER — LACTATED RINGERS IV SOLN
INTRAVENOUS | Status: DC
  Administered 2012-01-16 – 2012-01-17 (×5): via INTRAVENOUS

## 2012-01-16 MED ORDER — ZOLPIDEM TARTRATE 5 MG PO TABS
5.0000 mg | ORAL_TABLET | Freq: Every evening | ORAL | Status: DC | PRN
  Administered 2012-01-16: 5 mg via ORAL

## 2012-01-16 MED ORDER — LACTATED RINGERS IV BOLUS (SEPSIS)
300.0000 mL | Freq: Once | INTRAVENOUS | Status: AC
  Administered 2012-01-16: 1000 mL via INTRAVENOUS

## 2012-01-16 MED ORDER — PRENATAL MULTIVITAMIN CH
1.0000 | ORAL_TABLET | Freq: Every day | ORAL | Status: DC
  Administered 2012-01-16 – 2012-01-17 (×2): 1 via ORAL
  Filled 2012-01-16 (×2): qty 1

## 2012-01-16 NOTE — H&P (Signed)
Diane Kent is a 21 y.o. female, G1P0 at 69 weeks, admitted from [facility]U due to persistent UCs today and cervical change--initially 2 cm, 75%; then 3 cm, 80%, despite repeated doses of Procardia for total of 40 mg. Received initial dose of Betamethasone in [facility]U, with cultures, GBS, wet prep, UA done and culture sent.  Had IC earlier in the afternoon.  Patient Active Problem List  Diagnosis  . Anemia  . Premature labor   History of present pregnancy: Patient entered care at 14 weeks.   EDC of 02/27/12 was established by normal LMP and was in agreement with [place] at 7 5/7 weeks in [facility]U Anatomy scan:  189 6/7 weeks, with normal findings and an anterior placenta.   Additional [place] evaluations:  None Significant prenatal events:  Episode of vaginal bleeding at 29 weeks after IC. URI treated at 26 weeks with Zpak Last evaluation:  12/3 at office, no issues  Maternal Medical History:  Contractions: Onset was yesterday.      OB History    Grav Para Term Preterm Abortions TAB SAB Ect Mult Living   1 0 0 0 0 0 0 0 0 0      Past Medical History  Diagnosis Date  . Pneumonia     AS CHILD  . Stomach disease 2009    NO MEDS CURRENTLY  . Anxiety 2009    ON MEDS X 1 YEAR  . Infection 02/2011    UTI  . Yeast infection   . H/O bacterial infection   . History of chlamydia    Past Surgical History  Procedure Date  . Wisdom tooth extraction 2007  . Tonsillectomy 2009    T&A   Family History: family history includes Arthritis in her maternal grandmother; Asthma in her other; Diabetes in her paternal grandfather; Fibromyalgia in her paternal aunt; Hyperlipidemia in her maternal grandmother, paternal grandfather, and paternal grandmother; Hypertension in her maternal grandmother, paternal aunt, paternal grandfather, paternal grandmother, and paternal uncle; Lupus in her maternal aunt; and Mental illness in her paternal uncle.  Social History:  reports that she quit smoking about 6 months ago. She has  never used smokeless tobacco. She reports that she does not drink alcohol or use illicit drugs. Mother is present with her today, FOB is not present.  Patient is unemployed.   Prenatal Transfer Tool  Maternal Diabetes: No Genetic Screening: Normal Quad screen Maternal Ultrasounds/Referrals: Normal Fetal Ultrasounds or other Referrals:  None Maternal Substance Abuse:  No Significant Maternal Medications:  None Significant Maternal Lab Results:  None--GBS and cultures pending from today Other Comments:  34 weeks with PTL--1st dose betamethasone at 1:23am 01/16/12.  ROS:  Contractions, +FM.  Dilation: 2.5 Effacement (%): 80 Blood pressure 118/85, pulse 79, temperature 97.7 F (36.5 C), temperature source Oral, resp. rate 18, last menstrual period 05/23/2011.  Exam Physical Exam  Chest clear Heart RRR without murmur Abd gravid, NT Pelvic--initially cx 2 cm, 75%, vtx, -1 on arrival.  Re-check at 3:20am 3 cm, 80%, vtx, -1, with vtx presentation verified by BS [place]. Ext WNL  FHR reactive, no decels UCs q 3-4 min persistently, despite total of 40 mg Procardia in MAU  Results for orders placed during the hospital encounter of 01/16/12 (from the past 24 hour(s))  URINALYSIS, ROUTINE W REFLEX MICROSCOPIC     Status: Abnormal   Collection Time   01/16/12 12:30 AM      Component Value Range   Color, Urine YELLOW  YELLOW   APPearance  CLEAR  CLEAR   Specific Gravity, Urine <1.005 (*) 1.005 - 1.030   pH 7.0  5.0 - 8.0   Glucose, UA NEGATIVE  NEGATIVE mg/dL   Hgb urine dipstick NEGATIVE  NEGATIVE   Bilirubin Urine NEGATIVE  NEGATIVE   Ketones, ur NEGATIVE  NEGATIVE mg/dL   Protein, ur NEGATIVE  NEGATIVE mg/dL   Urobilinogen, UA 0.2  0.0 - 1.0 mg/dL   Nitrite NEGATIVE  NEGATIVE   Leukocytes, UA NEGATIVE  NEGATIVE  WET PREP, GENITAL     Status: Abnormal   Collection Time   01/16/12  1:00 AM      Component Value Range   Yeast Wet Prep HPF POC NONE SEEN  NONE SEEN   Trich, Wet Prep NONE  SEEN  NONE SEEN   Clue Cells Wet Prep HPF POC NONE SEEN  NONE SEEN   WBC, Wet Prep HPF POC FEW (*) NONE SEEN     Prenatal labs: ABO, Rh: O/POS/-- (07/31 1028) Antibody: NEG (07/31 1028) Rubella: 22.3 (07/31 1028) RPR: NON REAC (10/24 1031)  HBsAg: NEGATIVE (07/31 1028)  HIV: NON REACTIVE (07/31 1028)  GBS:   Pending from [facility]U today Cultures negative at NOB, pending from [facility]U today Glucola WNL Hgb 12 at NOB, 10.8 at 28 weeks Pap WNL in Feb/March 2013  Assessment/Plan: IUP at 34 weeks PTL, unresponsive to po Procardia Leukocytosis, likely due to PTL  Plan: Admit to Antenatal per consult with Dr. Su Hilt Routine [facility] Antenatal orders Complete betamethasone course Continue IV hydration. Give single dose Terbutaline now. [place] for growth and fluid. GBS and cultures pending. Repeat CBC 01/17/12.  Araceli Coufal 01/16/2012, 4:03 AM

## 2012-01-16 NOTE — Progress Notes (Signed)
Pt states she was treated for depression in high school in "09

## 2012-01-16 NOTE — MAU Provider Note (Signed)
History   21 yo G1P0 at 34 weeks presented c/o contractions since 8pm.  Did have sex earlier tonight, denies bleeding or leaking.  Reports +FM, denies dysuria, discharge, or any other symptom.    Patient Active Problem List  Diagnosis  . Anemia    Chief Complaint  Patient presents with  . Contractions     OB History    Grav Para Term Preterm Abortions TAB SAB Ect Mult Living   1 0 0 0 0 0 0 0 0 0       Past Medical History  Diagnosis Date  . Pneumonia     AS CHILD  . Stomach disease 2009    NO MEDS CURRENTLY  . Anxiety 2009    ON MEDS X 1 YEAR  . Infection 02/2011    UTI  . Yeast infection   . H/O bacterial infection   . History of chlamydia     Past Surgical History  Procedure Date  . Wisdom tooth extraction 2007  . Tonsillectomy 2009    T&A    Family History  Problem Relation Age of Onset  . Lupus Maternal Aunt   . Hypertension Paternal Aunt   . Fibromyalgia Paternal Aunt   . Mental illness Paternal Uncle   . Hypertension Paternal Uncle   . Arthritis Maternal Grandmother   . Hyperlipidemia Maternal Grandmother   . Hypertension Maternal Grandmother   . Hyperlipidemia Paternal Grandmother   . Hypertension Paternal Grandmother   . Diabetes Paternal Grandfather   . Hyperlipidemia Paternal Grandfather   . Hypertension Paternal Grandfather   . Asthma Other     History  Substance Use Topics  . Smoking status: Former Smoker -- 1.0 packs/day    Quit date: 06/28/2011  . Smokeless tobacco: Never Used  . Alcohol Use: No     Comment: social alcohol before pregnancy    Allergies: No Known Allergies  Prescriptions prior to admission  Medication Sig Dispense Refill  . acetaminophen (TYLENOL) 500 MG tablet Take 1,000 mg by mouth every 6 (six) hours as needed. Pelvic pain      . azithromycin (ZITHROMAX Z-PAK) 250 MG tablet Take 2 tablets today, then 1 per day for 4 days, finish all of the medicine  6 each  0  . calcium carbonate (TUMS EX) 750 MG chewable  tablet Chew 1 tablet by mouth daily. heartburn      . Prenatal Vit-FePoly-FA-DHA (VITAFOL-ONE) 29-1-200 MG CAPS Take 1 capsule by mouth daily.  30 capsule  11     Physical Exam   Blood pressure 118/85, pulse 79, temperature 97.7 F (36.5 C), temperature source Oral, resp. rate 18, last menstrual period 05/23/2011.  Chest clear Heart RRR without murmur Abd gravid, NT Pelvic--no d/ in vault, cervix loose 2 cm, 75%, vtx, -1, soft. Ext WNL  FHR non-reactive initially, but negative spontaneous CST--now with 10 beat accels. UCs q 2-4 min, moderate to palpation.    ED Course  IUP at 34 weeks PTL  Plan: Consulted with Dr. Su Hilt Procardia 10 mg po q 20 min prn x 4 doses IV hydration Betamethasone 12 mg IM q 24 x 2 doses Will CTO at present--re-evaluate cervix prn. Reviewed risks of PTL/PTD and need for intervention and observation--patient agreeable with plan.   Mykhia Danish CNM, [place] 01/16/2012 1:06 AM   Addendum: Received 4 doses Procardia 10 mg, with no change in frequency or quality of contractions--still q 3-4 min, moderate quality, 4-5/10, but in NAD Received 1st dose Betamethasone at 1:23p  FHR reactive, no decels.  Re-check of cx--3 cm, 80%, membranes tight against cervix, vtx -1 (vtx verified by BS [place]).  Results for orders placed during the hospital encounter of 01/16/12 (from the past 24 hour(s))  URINALYSIS, ROUTINE W REFLEX MICROSCOPIC     Status: Abnormal   Collection Time   01/16/12 12:30 AM      Component Value Range   Color, Urine YELLOW  YELLOW   APPearance CLEAR  CLEAR   Specific Gravity, Urine <1.005 (*) 1.005 - 1.030   pH 7.0  5.0 - 8.0   Glucose, UA NEGATIVE  NEGATIVE mg/dL   Hgb urine dipstick NEGATIVE  NEGATIVE   Bilirubin Urine NEGATIVE  NEGATIVE   Ketones, ur NEGATIVE  NEGATIVE mg/dL   Protein, ur NEGATIVE  NEGATIVE mg/dL   Urobilinogen, UA 0.2  0.0 - 1.0 mg/dL   Nitrite NEGATIVE  NEGATIVE   Leukocytes, UA NEGATIVE  NEGATIVE  WET PREP,  GENITAL     Status: Abnormal   Collection Time   01/16/12  1:00 AM      Component Value Range   Yeast Wet Prep HPF POC NONE SEEN  NONE SEEN   Trich, Wet Prep NONE SEEN  NONE SEEN   Clue Cells Wet Prep HPF POC NONE SEEN  NONE SEEN   WBC, Wet Prep HPF POC FEW (*) NONE SEEN   Consulted with Dr. Su Hilt. Will admit for observation to Antenatal. Continue IV hydration Complete betamethasone course. [place] for growth and fluid Reviewed plan with patient and mother, questions reviewed. Will give Terbutaline SQ now.  Nigel Bridgeman, CNM 3:50am

## 2012-01-16 NOTE — Progress Notes (Signed)
1635- Neonatology notified that pt needs a NICU consult.

## 2012-01-16 NOTE — Consult Note (Signed)
Neonatology Consult  Note:  At the request of the patients obstetrician Dr. Pennie Rushing I met with Janie Morning and her mother.  Ms Huge is a 21 y.o. female, G1P0 at 48 weeks, admitted from MAU today due to preterm labor.  She was initially treated with repeated doses of Procardia and subsequently terbutaline.   1st dose betamethasone at 1:23am 01/16/12.  EDC of 02/27/12 was established by normal LMP and was in agreement with [place] at 7 5/7 weeks in MAU. We reviewed initial delivery room management, including CPAP, [place], and low but certainly possible need for intubation for surfactant administration.  We discussed feeding immaturity and need for full po intake with multiple days of good weight gain and no apnea or bradycardia before discharge.  We reviewed increased risk of jaundice, infection, and temperature instability.   Discussed likely length of stay. Thank you for allowing [place] to participate in her care.  Please call with questions.  John Giovanni, DO  Neonatologist  Face to face time 20 min.

## 2012-01-16 NOTE — Progress Notes (Addendum)
Now on Antenatal Unit--US completed, but results not available yet. FHR reactive. UCs now more irritability-type pattern, irregular. Received Terbutaline SQ at 4:09a--appears to have diminished contractions.  Will CTO at present. Diff does show shift to the left Await [place] results Will repeat CBC with diff tomorrow. NICU consult due to 34 weeks, PTL.  Results for orders placed during the hospital encounter of 01/16/12 (from the past 24 hour(s))  URINALYSIS, ROUTINE W REFLEX MICROSCOPIC     Status: Abnormal   Collection Time   01/16/12 12:30 AM      Component Value Range   Color, Urine YELLOW  YELLOW   APPearance CLEAR  CLEAR   Specific Gravity, Urine <1.005 (*) 1.005 - 1.030   pH 7.0  5.0 - 8.0   Glucose, UA NEGATIVE  NEGATIVE mg/dL   Hgb urine dipstick NEGATIVE  NEGATIVE   Bilirubin Urine NEGATIVE  NEGATIVE   Ketones, ur NEGATIVE  NEGATIVE mg/dL   Protein, ur NEGATIVE  NEGATIVE mg/dL   Urobilinogen, UA 0.2  0.0 - 1.0 mg/dL   Nitrite NEGATIVE  NEGATIVE   Leukocytes, UA NEGATIVE  NEGATIVE  WET PREP, GENITAL     Status: Abnormal   Collection Time   01/16/12  1:00 AM      Component Value Range   Yeast Wet Prep HPF POC NONE SEEN  NONE SEEN   Trich, Wet Prep NONE SEEN  NONE SEEN   Clue Cells Wet Prep HPF POC NONE SEEN  NONE SEEN   WBC, Wet Prep HPF POC FEW (*) NONE SEEN  CBC     Status: Abnormal   Collection Time   01/16/12  4:00 AM      Component Value Range   WBC 18.8 (*) 4.0 - 10.5 K/uL   RBC 3.87  3.87 - 5.11 MIL/uL   Hemoglobin 11.2 (*) 12.0 - 15.0 g/dL   HCT 45.4 (*) 09.8 - 11.9 %   MCV 87.3  78.0 - 100.0 fL   MCH 28.9  26.0 - 34.0 pg   MCHC 33.1  30.0 - 36.0 g/dL   RDW 14.7  82.9 - 56.2 %   Platelets 421 (*) 150 - 400 K/uL  DIFFERENTIAL     Status: Abnormal   Collection Time   01/16/12  4:00 AM      Component Value Range   Neutrophils Relative 83 (*) 43 - 77 %   Neutro Abs 15.3 (*) 1.7 - 7.7 K/uL   Lymphocytes Relative 11 (*) 12 - 46 %   Lymphs Abs 2.1  0.7 -  4.0 K/uL   Monocytes Relative 5  3 - 12 %   Monocytes Absolute 1.0  0.1 - 1.0 K/uL   Eosinophils Relative 0  0 - 5 %   Eosinophils Absolute 0.0  0.0 - 0.7 K/uL   Basophils Relative 0  0 - 1 %   Basophils Absolute 0.0  0.0 - 0.1 K/uL   Nigel Bridgeman, CNM 01/15/13 6:20am

## 2012-01-16 NOTE — Progress Notes (Signed)
Patient ID: Diane Kent, female   DOB: 1990-11-27, 21 y.o.   MRN: [id number] Diane Kent is a 21 y.o. G1P0000 at [redacted]w[redacted]d by LMP admitted for preterm uterine contractions  Subjective: GI: negative, now having some contractions that she rates on a 3-4/10 scale and has not request pain medication GU: Denies: dysuria, frequency/urgency, hematuria, genital discharge, vaginal bleeding OB: Good fetal movement        Objective: BP 112/68  Pulse 97  Temp 98.3 F (36.8 C) (Oral)  Resp 18  Ht 5\' 2"  (1.575 m)  Wt 169 lb (76.658 kg)  BMI 30.91 kg/m2  LMP 05/23/2011      FHT:  FHR: 130-140  bpm, variability: moderate,  accelerations:  Present,  decelerations:  Absent UC:   regular, every 4 minutes SVE:   Dilation: 2.5 Effacement (%): 80  Labs: Lab Results  Component Value Date   WBC 18.8* 01/16/2012   HGB 11.2* 01/16/2012   HCT 33.8* 01/16/2012   MCV 87.3 01/16/2012   PLT 421* 01/16/2012    Assessment / Plan: Preterm uterine contractions without significant cervical change over the patient's hospitalization  Fetal Wellbeing:  Category I Second dose of betamethasone given NICU consult completed Will observe for active labor Will keep comfortable with analgesics but do not plan further tocolysis   Haakon Titsworth P 01/16/2012, 7:41 PM

## 2012-01-16 NOTE — MAU Note (Signed)
Pt states she started  Having contractions at about 2000 01/15/12. Pt states she is uncomfortable and feels pain in her legs and lower back

## 2012-01-16 NOTE — Progress Notes (Signed)
Dr. Pennie Rushing notified that the pt had 4-5 uc's between 1300-1400 and is rating them a 3 on a pain scale of 1-10. From 8119-1478, after empting her bladder and lying on her rt side, she continued to contract 4-8 min. For now we will continue to watch the pt since she is rating her uc's a 3 on her pain scale.

## 2012-01-17 DIAGNOSIS — D649 Anemia, unspecified: Secondary | ICD-10-CM | POA: Diagnosis present

## 2012-01-17 DIAGNOSIS — O4700 False labor before 37 completed weeks of gestation, unspecified trimester: Secondary | ICD-10-CM

## 2012-01-17 LAB — CBC WITH DIFFERENTIAL/PLATELET
Basophils Absolute: 0 10*3/uL (ref 0.0–0.1)
Eosinophils Relative: 0 % (ref 0–5)
HCT: 28.1 % — ABNORMAL LOW (ref 36.0–46.0)
Hemoglobin: 9.6 g/dL — ABNORMAL LOW (ref 12.0–15.0)
Lymphocytes Relative: 8 % — ABNORMAL LOW (ref 12–46)
Lymphs Abs: 1.8 10*3/uL (ref 0.7–4.0)
MCV: 87.8 fL (ref 78.0–100.0)
Monocytes Absolute: 1.8 10*3/uL — ABNORMAL HIGH (ref 0.1–1.0)
Monocytes Relative: 8 % (ref 3–12)
Neutro Abs: 19.8 10*3/uL — ABNORMAL HIGH (ref 1.7–7.7)
RDW: 12.9 % (ref 11.5–15.5)
WBC: 23.4 10*3/uL — ABNORMAL HIGH (ref 4.0–10.5)

## 2012-01-17 LAB — URINE CULTURE

## 2012-01-17 MED ORDER — DSS 100 MG PO CAPS: ORAL_CAPSULE | ORAL | Status: DC

## 2012-01-17 NOTE — Discharge Summary (Signed)
Physician Discharge Summary  Patient ID: Diane Kent MRN: [id number] DOB/AGE: May 09, 1990 21 y.o.  Admit date: 01/16/2012 Discharge date: 01/17/2012  Admission Diagnoses:preterm labor  Discharge Diagnoses:  Active Problems:  Premature labor, preterm contractions and cervical dilation  Anemia   Discharged Condition: stable  Hospital Course: Cultures for GC/CHLAMYDIA negative.  GBS culture pending at discharge.  Received 2 doses B-methasone  about 16 hrs apart.  No tocolytic meds given for more than 24 hours prior to discharge.  Contractions abated slowly and cervix remained stable at 2cm by my exam on discharge  Consults: Neonatology  Significant Diagnostic Studies: labs: , microbiology: urine culture: pending and GBS pending  Treatments: IV hydration, steroids: B-methasone and Procardia and terbutaline  Discharge Exam: Blood pressure 105/56, pulse 93, temperature 98.4 F (36.9 C), temperature source Oral, resp. rate 20, height 5\' 2"  (1.575 m), weight 169 lb (76.658 kg), last menstrual period 05/23/2011. General appearance: alert, cooperative, appears stated age and no distress Pelvic: no cervical motion tenderness and cervix 2cm and 60-70% effaced.  vertex at -2   Disposition: 01-Home or Self Care  Discharge Orders    Future Appointments: Provider: Department: Dept Phone: Center:   01/20/2012 10:00 AM Purcell Nails, MD [facility] Obstetrics & Gynecology [phone] None       Medication List     As of 01/17/2012 10:39 AM    TAKE these medications         calcium carbonate 750 MG chewable tablet   Commonly known as: TUMS EX   Chew 1 tablet by mouth daily as needed. heartburn      DSS 100 MG Caps   One or two daily as needed for constipation      prenatal multivitamin Tabs   Take 1 tablet by mouth at bedtime.           Follow-up Information    Follow up with Purcell Nails, MD. On 01/20/2012. (pt has appt at 10 am)    Contact information:   [address] [place] [address] [phone]          Signed: Hal Morales 01/17/2012, 10:39 AM

## 2012-01-18 LAB — CULTURE, BETA STREP (GROUP B ONLY)

## 2012-01-20 ENCOUNTER — Encounter: Payer: Self-pay | Admitting: Obstetrics and Gynecology

## 2012-01-20 ENCOUNTER — Ambulatory Visit (INDEPENDENT_AMBULATORY_CARE_PROVIDER_SITE_OTHER): Payer: Medicaid Other | Admitting: Obstetrics and Gynecology

## 2012-01-20 VITALS — BP 90/58 | Wt 165.0 lb

## 2012-01-20 DIAGNOSIS — Z331 Pregnant state, incidental: Secondary | ICD-10-CM

## 2012-01-20 DIAGNOSIS — Z3490 Encounter for supervision of normal pregnancy, unspecified, unspecified trimester: Secondary | ICD-10-CM

## 2012-01-20 NOTE — Progress Notes (Signed)
[redacted]w[redacted]d C/o irreg ctxs Ph 3.5, -N, -P GBS/GC/CT all neg FKCs and labor precautions

## 2012-01-22 ENCOUNTER — Telehealth: Payer: Self-pay | Admitting: Obstetrics and Gynecology

## 2012-01-22 ENCOUNTER — Encounter: Payer: Self-pay | Admitting: Obstetrics and Gynecology

## 2012-01-22 ENCOUNTER — Ambulatory Visit (INDEPENDENT_AMBULATORY_CARE_PROVIDER_SITE_OTHER): Payer: Medicaid Other | Admitting: Obstetrics and Gynecology

## 2012-01-22 VITALS — BP 90/68 | Wt 165.0 lb

## 2012-01-22 DIAGNOSIS — Z331 Pregnant state, incidental: Secondary | ICD-10-CM

## 2012-01-22 NOTE — Telephone Encounter (Signed)
Tc TO pt on response to VM. States has been having sharp pains in upper abd since 5 am. Is not sure if is contractions. Occuring > 5 x/hr. +FM No vag leakage.  Per [facility], scheduled for eval with [facility]k today. Pt prefers to wait for appt until 3:30.  To increase water.

## 2012-01-22 NOTE — Progress Notes (Signed)
[redacted]w[redacted]d C/o more painful & more frequent ctx's C/o pressure in both hips  Pt unable to void

## 2012-01-27 NOTE — Progress Notes (Signed)
Patient ID: CHIAMAKA LATKA, female   DOB: Nov 04, 1990, 21 y.o.   MRN: [id number] Vag unchanged Reviewed s/sto report preterm labor if 6 contractions in 1 hour after po fluids, rest and frequent voids, srom, vag bleeding, daily kick counts to report, encouraged 8 water daily and frequent voids. Lavera Guise, CNM

## 2012-02-04 NOTE — L&D Delivery Note (Signed)
Delivery Note At 10:00 PM a viable female was delivered via Vaginal, Spontaneous Delivery (Presentation: Left Occiput Anterior).  Infant vigorous. APGAR: 9,9; weight pending.   Placenta status: Intact, Spontaneous.  Cord: 3 vessels with the following complications: tight nuchal and bilat shoulder cord. Reduced cord around shoulders to deliver body. Cord pH: NA  Anesthesia: Epidural  Episiotomy: None Lacerations: None Suture Repair: NA Est. Blood Loss (mL): 400  Mom to postpartum.  Baby to nursery-stable. Placenta to: pathology Feeding: breast Circ: NA Contraception: undecided  Dorathy Kinsman 02/12/2012, 10:29 PM

## 2012-02-05 ENCOUNTER — Ambulatory Visit (INDEPENDENT_AMBULATORY_CARE_PROVIDER_SITE_OTHER): Payer: Medicaid Other | Admitting: Obstetrics and Gynecology

## 2012-02-05 VITALS — BP 108/62 | Wt 171.0 lb

## 2012-02-05 DIAGNOSIS — Z331 Pregnant state, incidental: Secondary | ICD-10-CM

## 2012-02-05 NOTE — Progress Notes (Signed)
[redacted]w[redacted]d Beta strep negative. Return to office in 1 week. Dr. Stefano Gaul

## 2012-02-05 NOTE — Progress Notes (Signed)
[redacted]w[redacted]d  Pt c/o: pain pressure  Pt believes she lost her mucous plug.  Pt desires cervix check.  GBS done on 01-16-12 : Negative

## 2012-02-09 ENCOUNTER — Telehealth: Payer: Self-pay | Admitting: Obstetrics and Gynecology

## 2012-02-09 NOTE — Telephone Encounter (Signed)
Pt called again, states has been timing contractions, has been 5-7 min apart since 1330, had taken a shower and has taken Tylenol w/ minimal relief, is having a brown/mucusy d/c and having a pulling sensation with walking.  Per VL advised pt to continue monitoring contractions.  If becomes 5 minutes apart consistently for 2 hours to call back, pt voices agreement.

## 2012-02-09 NOTE — Telephone Encounter (Signed)
Tc to pt regarding msg.  Pt is [redacted]w[redacted]d, beginning this am c/o upper abd pain, above belly button, the area tightens off/on then she gets a sharp pain.  Pt states the baby then moves after the pain has subsided.  Pt denies any vaginal bleeding.  Pt has not eaten/drank much today.  Advised pt to try taking Tylenol, take a warm bath/shower, push fluids all day today, rest.  Pt understands how to time contractions, will call back with any concerns.  Pt says she just started having diarrhea.  Pt advised to drink clear liquids all day today and to eat a bland diet/BRAT diet.  Next appt is on Thursday 02/12/12, pt voices agreement.

## 2012-02-11 ENCOUNTER — Telehealth: Payer: Self-pay | Admitting: Obstetrics and Gynecology

## 2012-02-11 ENCOUNTER — Inpatient Hospital Stay (HOSPITAL_COMMUNITY): Payer: Medicaid Other

## 2012-02-11 ENCOUNTER — Encounter (HOSPITAL_COMMUNITY): Payer: Self-pay | Admitting: *Deleted

## 2012-02-11 ENCOUNTER — Inpatient Hospital Stay (HOSPITAL_COMMUNITY)
Admission: AD | Admit: 2012-02-11 | Discharge: 2012-02-11 | Disposition: A | Discharge status: Home / Self Care | Payer: Medicaid Other | Source: Ambulatory Visit | Attending: Obstetrics and Gynecology | Admitting: Obstetrics and Gynecology

## 2012-02-11 DIAGNOSIS — O99891 Other specified diseases and conditions complicating pregnancy: Secondary | ICD-10-CM | POA: Insufficient documentation

## 2012-02-11 DIAGNOSIS — O479 False labor, unspecified: Secondary | ICD-10-CM | POA: Insufficient documentation

## 2012-02-11 NOTE — Telephone Encounter (Signed)
Spoke with pt rgd concerns pt states she's been leaking fluid since yesterday about 1 pm pt states she was eating and pants were damp and has been damp since pt states irreg contractions some cramping no spotting no bleeding baby moving but not as much advised pt to go to [facility] for eval pt voice understanding

## 2012-02-11 NOTE — OB Triage Provider Note (Signed)
Pt came to MAU for evaluation.  She states she has been leaking fluid for two days.  She has occ ctx.  No VB good fetal movement BP 110/70  Pulse 99  Temp 98.8 F (37.1 C) (Oral)  Resp 18  Ht 5\' 2"  (1.575 m)  Wt 171 lb (77.565 kg)  BMI 31.28 kg/m2  LMP 05/23/2011 NST 145-150 reactive category 1  toco q 4-5 minutes cx 3-4 /50/-1 no pooling amnisure is negative UD BPP 8/8 AFI 15 Pregnancy at 37 5/7 weeks No s/s of ROM Labor and LOF precautions given to pt Keep normal appointment

## 2012-02-11 NOTE — MAU Note (Signed)
C/o ? srom yesterday around 1300;

## 2012-02-12 ENCOUNTER — Inpatient Hospital Stay (HOSPITAL_COMMUNITY): Payer: Medicaid Other | Admitting: Anesthesiology

## 2012-02-12 ENCOUNTER — Other Ambulatory Visit: Payer: Medicaid Other

## 2012-02-12 ENCOUNTER — Other Ambulatory Visit: Payer: Self-pay

## 2012-02-12 ENCOUNTER — Encounter (HOSPITAL_COMMUNITY): Payer: Self-pay | Admitting: Anesthesiology

## 2012-02-12 ENCOUNTER — Encounter (HOSPITAL_COMMUNITY): Payer: Self-pay | Admitting: *Deleted

## 2012-02-12 ENCOUNTER — Inpatient Hospital Stay (HOSPITAL_COMMUNITY)
Admission: AD | Admit: 2012-02-12 | Discharge: 2012-02-14 | DRG: 775 | Disposition: A | Discharge status: Home / Self Care | Payer: Medicaid Other | Source: Ambulatory Visit | Attending: Obstetrics and Gynecology | Admitting: Obstetrics and Gynecology

## 2012-02-12 ENCOUNTER — Ambulatory Visit (INDEPENDENT_AMBULATORY_CARE_PROVIDER_SITE_OTHER): Payer: Medicaid Other | Admitting: Obstetrics and Gynecology

## 2012-02-12 VITALS — BP 100/64 | Wt 171.0 lb

## 2012-02-12 DIAGNOSIS — O288 Other abnormal findings on antenatal screening of mother: Secondary | ICD-10-CM

## 2012-02-12 DIAGNOSIS — Z01419 Encounter for gynecological examination (general) (routine) without abnormal findings: Secondary | ICD-10-CM

## 2012-02-12 DIAGNOSIS — O368190 Decreased fetal movements, unspecified trimester, not applicable or unspecified: Secondary | ICD-10-CM

## 2012-02-12 DIAGNOSIS — D649 Anemia, unspecified: Secondary | ICD-10-CM | POA: Diagnosis not present

## 2012-02-12 DIAGNOSIS — O9903 Anemia complicating the puerperium: Secondary | ICD-10-CM | POA: Diagnosis not present

## 2012-02-12 HISTORY — DX: Major depressive disorder, single episode, unspecified: F32.9

## 2012-02-12 HISTORY — DX: Anemia, unspecified: D64.9

## 2012-02-12 LAB — CBC
HCT: 36.2 % (ref 36.0–46.0)
MCHC: 33.4 g/dL (ref 30.0–36.0)
MCV: 88.1 fL (ref 78.0–100.0)
Platelets: 353 10*3/uL (ref 150–400)
RBC: 4.11 MIL/uL (ref 3.87–5.11)
RDW: 13.7 % (ref 11.5–15.5)
WBC: 17.4 10*3/uL — ABNORMAL HIGH (ref 4.0–10.5)

## 2012-02-12 MED ORDER — LACTATED RINGERS IV SOLN: 500.0000 mL | Freq: Once | INTRAVENOUS | Status: DC

## 2012-02-12 MED ORDER — LACTATED RINGERS IV SOLN
500.0000 mL | INTRAVENOUS | Status: DC | PRN
  Administered 2012-02-12 (×2): 1000 mL via INTRAVENOUS

## 2012-02-12 MED ORDER — ONDANSETRON HCL 4 MG/2ML IJ SOLN: 4.0000 mg | Freq: Four times a day (QID) | INTRAMUSCULAR | Status: DC | PRN

## 2012-02-12 MED ORDER — PHENYLEPHRINE 40 MCG/ML (10ML) SYRINGE FOR IV PUSH (FOR BLOOD PRESSURE SUPPORT)
80.0000 ug | PREFILLED_SYRINGE | INTRAVENOUS | Status: DC | PRN
  Filled 2012-02-12: qty 5

## 2012-02-12 MED ORDER — OXYTOCIN 40 UNITS IN LACTATED RINGERS INFUSION - SIMPLE MED
62.5000 mL/h | INTRAVENOUS | Status: DC
  Filled 2012-02-12: qty 1000

## 2012-02-12 MED ORDER — DIPHENHYDRAMINE HCL 50 MG/ML IJ SOLN: 12.5000 mg | INTRAMUSCULAR | Status: DC | PRN

## 2012-02-12 MED ORDER — BUTORPHANOL TARTRATE 1 MG/ML IJ SOLN: 1.0000 mg | INTRAMUSCULAR | Status: DC | PRN

## 2012-02-12 MED ORDER — FENTANYL 2.5 MCG/ML BUPIVACAINE 1/10 % EPIDURAL INFUSION (WH - ANES)
14.0000 mL/h | INTRAMUSCULAR | Status: DC
  Administered 2012-02-12: 14 mL/h via EPIDURAL
  Filled 2012-02-12: qty 125

## 2012-02-12 MED ORDER — LACTATED RINGERS IV SOLN: INTRAVENOUS | Status: DC

## 2012-02-12 MED ORDER — FLEET ENEMA 7-19 GM/118ML RE ENEM: 1.0000 | ENEMA | RECTAL | Status: DC | PRN

## 2012-02-12 MED ORDER — LIDOCAINE HCL (PF) 1 % IJ SOLN
INTRAMUSCULAR | Status: DC | PRN
  Administered 2012-02-12 (×4): 4 mL

## 2012-02-12 MED ORDER — OXYTOCIN BOLUS FROM INFUSION
500.0000 mL | INTRAVENOUS | Status: DC
  Administered 2012-02-12: 500 mL via INTRAVENOUS

## 2012-02-12 MED ORDER — OXYCODONE-ACETAMINOPHEN 5-325 MG PO TABS: 1.0000 | ORAL_TABLET | ORAL | Status: DC | PRN

## 2012-02-12 MED ORDER — EPHEDRINE 5 MG/ML INJ: 10.0000 mg | INTRAVENOUS | Status: DC | PRN

## 2012-02-12 MED ORDER — ACETAMINOPHEN 325 MG PO TABS: 650.0000 mg | ORAL_TABLET | ORAL | Status: DC | PRN

## 2012-02-12 MED ORDER — CITRIC ACID-SODIUM CITRATE 334-500 MG/5ML PO SOLN: 30.0000 mL | ORAL | Status: DC | PRN

## 2012-02-12 MED ORDER — PHENYLEPHRINE 40 MCG/ML (10ML) SYRINGE FOR IV PUSH (FOR BLOOD PRESSURE SUPPORT): 80.0000 ug | PREFILLED_SYRINGE | INTRAVENOUS | Status: DC | PRN

## 2012-02-12 MED ORDER — LIDOCAINE HCL (PF) 1 % IJ SOLN
30.0000 mL | INTRAMUSCULAR | Status: DC | PRN
  Filled 2012-02-12: qty 30

## 2012-02-12 MED ORDER — IBUPROFEN 600 MG PO TABS: 600.0000 mg | ORAL_TABLET | Freq: Four times a day (QID) | ORAL | Status: DC | PRN

## 2012-02-12 MED ORDER — EPHEDRINE 5 MG/ML INJ
10.0000 mg | INTRAVENOUS | Status: DC | PRN
  Filled 2012-02-12: qty 4

## 2012-02-12 NOTE — MAU Note (Signed)
Sent from office, decreased fetal movement, BPP 6/10. For prolonged monitoring. Will call Dr Su Hilt.  Plan discussed with pt.

## 2012-02-12 NOTE — Progress Notes (Signed)
Diane Kent is a 22 y.o. G1P0000 at [redacted]w[redacted]d.  Subjective: More comfortable w/ epidural, but some contraction pain on left side. Pt lying on right side.   Objective: BP 115/58  Pulse 91  Temp 99.1 F (37.3 C) (Axillary)  Resp 18  Ht 5' (1.524 m)  Wt 171 lb (77.565 kg)  BMI 33.40 kg/m2  SpO2 100%  LMP 05/23/2011   Total I/O In: -  Out: 225 [Urine:225]  FHT:  FHR: 150 bpm, variability: minimal ,  accelerations:  Abscent,  decelerations:  Present two variables and 5 minute prolonged decel after pt on back for SVE and cath. Resolved w/ position change and O2. UC:   regular, every 2 minutes, strong SVE:   Dilation: 6 Effacement (%): 80 Station: 0 Exam by:: Ivonne Andrew CNM  Labs: Lab Results  Component Value Date   WBC 17.4* 02/12/2012   HGB 12.1 02/12/2012   HCT 36.2 02/12/2012   MCV 88.1 02/12/2012   PLT 353 02/12/2012    Assessment / Plan: Induction of labor due to non-reassuring fetal testing,  progressing well after AROM  Labor: Progressing normally Preeclampsia:  NA Fetal Wellbeing:  Category II Pain Control:  Epidural I/D:  n/a Anticipated MOD:  NSVD. Discussed possible need for C/S if decels become recurrent.   Dorathy Kinsman 02/12/2012, 8:41 PM

## 2012-02-12 NOTE — Progress Notes (Signed)
CNM notified of epidural placement, pts pain improved, decelerations, and RN interventions.

## 2012-02-12 NOTE — H&P (Addendum)
Diane Kent is a 22 y.o. female presenting from the office with BPP 6/10 (-2 for breathing and -2 for nonreactive NST).  Pt initially placed on monitor secondary to c/o decreased FM.  She presented yest to MAU for ROM and was negative for SROM.  Denies any further perception of LOF and denies VB.  She is s/p BMZ on 12/12 and 12/13.  History OB History    Grav Para Term Preterm Abortions TAB SAB Ect Mult Living   1 0 0 0 0 0 0 0 0 0      Past Medical History  Diagnosis Date  . Pneumonia     AS CHILD  . Stomach disease 2009    NO MEDS CURRENTLY  . Anxiety 2009    ON MEDS X 1 YEAR  . Infection 02/2011    UTI  . Yeast infection   . H/O bacterial infection   . History of chlamydia   . Anemia   . Depression 2009    in HS, ok now   Past Surgical History  Procedure Date  . Wisdom tooth extraction 2007  . Tonsillectomy 2009    T&A   Family History: family history includes Arthritis in her maternal grandmother; Asthma in her other; Birth defects in her maternal uncle; Diabetes in her paternal grandfather; Fibromyalgia in her paternal aunt; Hyperlipidemia in her maternal grandmother, paternal grandfather, and paternal grandmother; Hypertension in her maternal grandmother, paternal aunt, paternal grandfather, paternal grandmother, and paternal uncle; Lupus in her maternal aunt; and Mental illness in her paternal aunt and paternal uncle.  There is no history of Other. Social History:  reports that she quit smoking about 7 months ago. She has never used smokeless tobacco. She reports that she does not drink alcohol or use illicit drugs.   Prenatal Transfer Tool  Maternal Diabetes: No Genetic Screening: Normal Maternal Ultrasounds/Referrals: Normal (no fetal anomalies) Fetal Ultrasounds or other Referrals:  None Maternal Substance Abuse:  No Significant Maternal Medications:  Meds include: Other: s/p BMZ 12/12 and 12/13 Significant Maternal Lab Results:  Lab values include: Group B Strep  negative Other Comments:  None  ROS  Dilation: 3.5 Effacement (%): 60 Station: -2 Exam by:: Dr. Su Hilt Last menstrual period 05/23/2011. FHT cat 2 with decreased variability, no accels and no decels  Maternal Exam:  Uterine Assessment: Contraction strength is moderate.  Contraction duration is 1 minute. Abdomen: Patient reports no abdominal tenderness. Fetal presentation: vertex  Cervix: Cervix evaluated by digital exam.     Physical Exam  Constitutional: She appears well-developed.  Cardiovascular: Normal rate.   Respiratory: Effort normal.    Prenatal labs: ABO, Rh: O/POS/-- (07/31 1028) Antibody: NEG (07/31 1028) Rubella: 22.3 (07/31 1028) RPR: NON REACTIVE (12/13 0400)  HBsAg: NEGATIVE (07/31 1028)  HIV: NON REACTIVE (07/31 1028)  GBS:   NEG  Assessment/Plan: P1 at 37 6/7 wks with BPP 6/10 in early labor.  Fetal status is overall reassuring but with BPP 6/10 and tracing with decreased variability, will admit and plan AROM.      Settled in room by 6.30p and I rechecked her, she was unchanged.  Arom performed with clear fluid.  FHT cat 2 and shortly thereafter, pt was requesting epidural for which anesthesia has been consulted.   Purcell Nails 02/12/2012, 5:38 PM

## 2012-02-12 NOTE — Progress Notes (Signed)
106w6d Complains of decreased fetal movement. Nonstress test today. Return office in 1 week. Dr. Stefano Gaul

## 2012-02-12 NOTE — Progress Notes (Signed)
NST: nonreactive. Ultrasound: Single gestation, vertex, normal fluid, BPP 6/8 (no fetal breathing movement). To the hospital for observation. May need induction. Dr. Stefano Gaul

## 2012-02-12 NOTE — Anesthesia Preprocedure Evaluation (Signed)

## 2012-02-12 NOTE — Addendum Note (Signed)
Addended by: Mathis Bud on: 02/12/2012 12:22 PM   Modules accepted: Orders

## 2012-02-12 NOTE — Progress Notes (Signed)
[redacted]w[redacted]d  Pt desires cervix check today. Pt unable to leave sample pt given water.  Pt c/o: leaking fluids pt states the hospital told her she was urinating on herself. Pt states w/ movement she notices the fluid it just started yesterday.

## 2012-02-12 NOTE — Progress Notes (Signed)
Diane Kent is a 22 y.o. G1P0000 at [redacted]w[redacted]d.  Subjective: Moderate pressure w/ UC's  Objective: BP 117/74  Pulse 107  Temp 99.1 F (37.3 C) (Axillary)  Resp 18  Ht 5' (1.524 m)  Wt 77.565 kg (171 lb)  BMI 33.40 kg/m2  SpO2 100%  LMP 05/23/2011   Total I/O In: -  Out: 225 [Urine:225]  FHT:  FHR: 150 bpm, variability: minimal ,  accelerations:  Abscent,  decelerations:  Absent UC:   regular, every 2-3 minutes, strong SVE:   Dilation: 10 Effacement (%): 80 Station: +2 Exam by:: vKatrinka Blazing CNM  Labs: Lab Results  Component Value Date   WBC 17.4* 02/12/2012   HGB 12.1 02/12/2012   HCT 36.2 02/12/2012   MCV 88.1 02/12/2012   PLT 353 02/12/2012    Assessment / Plan: Induction of labor due to non-reassuring fetal testing,  progressing well. Start pushing.   Labor: Progressing normally Preeclampsia:  NA Fetal Wellbeing:  Category II Pain Control:  Epidural I/D:  n/a Anticipated MOD:  NSVD  Tishawn Friedhoff 02/12/2012, 9:37 PM

## 2012-02-12 NOTE — Anesthesia Procedure Notes (Signed)
Epidural Patient location during procedure: OB Start time: 02/12/2012 7:16 PM  Staffing Performed by: anesthesiologist   Preanesthetic Checklist Completed: patient identified, site marked, surgical consent, pre-op evaluation, timeout performed, IV checked, risks and benefits discussed and monitors and equipment checked  Epidural Patient position: sitting Prep: site prepped and draped and DuraPrep Patient monitoring: continuous pulse ox and blood pressure Approach: midline Injection technique: LOR air  Needle:  Needle type: Tuohy  Needle gauge: 17 G Needle length: 9 cm and 9 Needle insertion depth: 5 cm cm Catheter type: closed end flexible Catheter size: 19 Gauge Catheter at skin depth: 10 cm Test dose: negative  Assessment Events: blood not aspirated, injection not painful, no injection resistance, negative IV test and no paresthesia  Additional Notes Discussed risk of headache, infection, bleeding, nerve injury and failed or incomplete block.  Patient voices understanding and wishes to proceed.  Epidural placed easily on first attempt.  No paresthesia.. Patient tolerated procedure well with no apparent complications.  Jasmine December, MD Reason for block:procedure for pain

## 2012-02-13 LAB — CBC
HCT: 29.2 % — ABNORMAL LOW (ref 36.0–46.0)
Hemoglobin: 9.7 g/dL — ABNORMAL LOW (ref 12.0–15.0)
MCH: 28.8 pg (ref 26.0–34.0)
MCHC: 32.9 g/dL (ref 30.0–36.0)
MCV: 87.7 fL (ref 78.0–100.0)
Platelets: 325 10*3/uL (ref 150–400)
RBC: 3.33 MIL/uL — ABNORMAL LOW (ref 3.87–5.11)
RDW: 13.7 % (ref 11.5–15.5)
WBC: 20.9 10*3/uL — ABNORMAL HIGH (ref 4.0–10.5)

## 2012-02-13 MED ORDER — PRENATAL MULTIVITAMIN CH
1.0000 | ORAL_TABLET | Freq: Every day | ORAL | Status: DC
  Administered 2012-02-13 – 2012-02-14 (×2): 1 via ORAL
  Filled 2012-02-13 (×2): qty 1

## 2012-02-13 MED ORDER — WITCH HAZEL-GLYCERIN EX PADS: 1.0000 "application " | MEDICATED_PAD | CUTANEOUS | Status: DC | PRN

## 2012-02-13 MED ORDER — SIMETHICONE 80 MG PO CHEW: 80.0000 mg | CHEWABLE_TABLET | ORAL | Status: DC | PRN

## 2012-02-13 MED ORDER — LANOLIN HYDROUS EX OINT: 1.0000 "application " | TOPICAL_OINTMENT | CUTANEOUS | Status: DC | PRN

## 2012-02-13 MED ORDER — SENNOSIDES-DOCUSATE SODIUM 8.6-50 MG PO TABS
2.0000 | ORAL_TABLET | Freq: Every day | ORAL | Status: DC
  Administered 2012-02-13: 2 via ORAL

## 2012-02-13 MED ORDER — DIBUCAINE 1 % RE OINT: 1.0000 "application " | TOPICAL_OINTMENT | RECTAL | Status: DC | PRN

## 2012-02-13 MED ORDER — FERROUS SULFATE 325 (65 FE) MG PO TABS
325.0000 mg | ORAL_TABLET | Freq: Two times a day (BID) | ORAL | Status: DC
  Administered 2012-02-13 – 2012-02-14 (×2): 325 mg via ORAL
  Filled 2012-02-13 (×2): qty 1

## 2012-02-13 MED ORDER — BENZOCAINE-MENTHOL 20-0.5 % EX AERO: 1.0000 "application " | INHALATION_SPRAY | CUTANEOUS | Status: DC | PRN

## 2012-02-13 MED ORDER — ONDANSETRON HCL 4 MG PO TABS: 4.0000 mg | ORAL_TABLET | ORAL | Status: DC | PRN

## 2012-02-13 MED ORDER — DIPHENHYDRAMINE HCL 25 MG PO CAPS: 25.0000 mg | ORAL_CAPSULE | Freq: Four times a day (QID) | ORAL | Status: DC | PRN

## 2012-02-13 MED ORDER — MEASLES, MUMPS & RUBELLA VAC ~~LOC~~ INJ
0.5000 mL | INJECTION | Freq: Once | SUBCUTANEOUS | Status: DC
  Filled 2012-02-13: qty 0.5

## 2012-02-13 MED ORDER — PNEUMOCOCCAL VAC POLYVALENT 25 MCG/0.5ML IJ INJ
0.5000 mL | INJECTION | INTRAMUSCULAR | Status: AC
  Administered 2012-02-14: 0.5 mL via INTRAMUSCULAR
  Filled 2012-02-13: qty 0.5

## 2012-02-13 MED ORDER — IBUPROFEN 600 MG PO TABS
600.0000 mg | ORAL_TABLET | Freq: Four times a day (QID) | ORAL | Status: DC
  Administered 2012-02-13 – 2012-02-14 (×7): 600 mg via ORAL
  Filled 2012-02-13 (×8): qty 1

## 2012-02-13 MED ORDER — MAGNESIUM HYDROXIDE 400 MG/5ML PO SUSP: 30.0000 mL | ORAL | Status: DC | PRN

## 2012-02-13 MED ORDER — OXYCODONE-ACETAMINOPHEN 5-325 MG PO TABS
1.0000 | ORAL_TABLET | ORAL | Status: DC | PRN
  Administered 2012-02-13: 1 via ORAL
  Administered 2012-02-14: 2 via ORAL
  Filled 2012-02-13: qty 1
  Filled 2012-02-13: qty 2

## 2012-02-13 MED ORDER — TETANUS-DIPHTH-ACELL PERTUSSIS 5-2.5-18.5 LF-MCG/0.5 IM SUSP: 0.5000 mL | Freq: Once | INTRAMUSCULAR | Status: DC

## 2012-02-13 MED ORDER — ZOLPIDEM TARTRATE 5 MG PO TABS: 5.0000 mg | ORAL_TABLET | Freq: Every evening | ORAL | Status: DC | PRN

## 2012-02-13 MED ORDER — ONDANSETRON HCL 4 MG/2ML IJ SOLN: 4.0000 mg | INTRAMUSCULAR | Status: DC | PRN

## 2012-02-13 NOTE — Anesthesia Postprocedure Evaluation (Signed)
Anesthesia Post Note  Patient: Diane Kent  Procedure(s) Performed: * No procedures listed *  Anesthesia type: Epidural  Patient location: Mother/Baby  Post pain: Pain level controlled  Post assessment: Post-op Vital signs reviewed  Last Vitals:  Filed Vitals:   02/13/12 1359  BP: 118/78  Pulse: 86  Temp: 36.7 C  Resp: 18    Post vital signs: Reviewed  Level of consciousness:alert  Complications: No apparent anesthesia complications  Anesthesia Post-op Note

## 2012-02-13 NOTE — Progress Notes (Signed)
Post Partum Day 1 :S/P SVD, intact perinium Subjective: Patient up ad lib, denies syncope or dizziness. Feeding:  Breastfeeding Contraceptive plan:   Undecided at present  Objective: Blood pressure 99/65, pulse 90, temperature 99.1 F (37.3 C), temperature source Oral, resp. rate 18, height 5' (1.524 m), weight 171 lb (77.565 kg), last menstrual period 05/23/2011, SpO2 98.00%, unknown if currently breastfeeding.  Physical Exam:  General: alert Lochia: appropriate Uterine Fundus: firm Incision: Intact perineum DVT Evaluation: No evidence of DVT seen on physical exam. Negative Homan's sign.   Basename 02/13/12 0535 02/12/12 1800  HGB 9.7* 12.1  HCT 29.2* 36.2    Assessment/Plan: S/P Vaginal delivery day 1 Continue current care Plan for discharge tomorrow   LOS: 1 day   Collins Kerby 02/13/2012, 8:45 AM

## 2012-02-14 MED ORDER — IBUPROFEN 600 MG PO TABS: 600.0000 mg | ORAL_TABLET | Freq: Four times a day (QID) | ORAL | Status: DC

## 2012-02-14 MED ORDER — OXYCODONE-ACETAMINOPHEN 5-325 MG PO TABS: 1.0000 | ORAL_TABLET | ORAL | Status: DC | PRN

## 2012-02-14 MED ORDER — FERROUS SULFATE 325 (65 FE) MG PO TABS: 325.0000 mg | ORAL_TABLET | Freq: Every day | ORAL | Status: DC

## 2012-02-14 NOTE — Progress Notes (Signed)
Post Partum Day 2 Subjective: Reports feeling well.  Ambulating, voiding and tol po liquids and solids without difficulty.  Pos flatus, pos BM.  Denies weakness or dizziness.  Working on breastfeeding.   Objective: Blood pressure 102/68, pulse 76, temperature 97.9 F (36.6 C), temperature source Oral, resp. rate 18, height 5' (1.524 m), weight 171 lb (77.565 kg), last menstrual period 05/23/2011, SpO2 98.00%, unknown if currently breastfeeding. Filed Vitals:   02/13/12 0445 02/13/12 1359 02/13/12 2120 02/14/12 0544  BP: 99/65 118/78 96/61 102/68  Pulse: 90 86 87 76  Temp: 99.1 F (37.3 C) 98 F (36.7 C) 98.2 F (36.8 C) 97.9 F (36.6 C)  TempSrc: Oral Oral Oral Oral  Resp: 18 18 18 18   Height:      Weight:      SpO2: 98%      Physical Exam:  General: alert, cooperative and no distress Heart:  RRR Lungs:  CTA bilat Abd:  Soft, NT with pos BS x 4 quads. Lochia: appropriate, sm rubra Uterine Fundus: firm, NT 1 below umb. Incision: Perineum intact-no lacerations DVT Evaluation: No evidence of DVT seen on physical exam. Negative Homan's sign bilat. No significant calf/ankle edema.   Basename 02/13/12 0535 02/12/12 1800  HGB 9.7* 12.1  HCT 29.2* 36.2  Admission Hgb 12.1  Assessment/Plan: Stable s/p vaginal delivery Asymptomatic anemia  Discharge to home. RTO 6wks for f/u at [facility]. Discharge instructions reviewed. Rx: Motrin, Percocet, Iron Contraception:  Plans Implanon   LOS: 2 days   Cillian Gwinner O. 02/14/2012, 12:18 PM

## 2012-02-14 NOTE — Discharge Summary (Signed)
Obstetric Discharge Summary Reason for Admission: onset of labor Prenatal Procedures: NST and ultrasound Intrapartum Procedures: spontaneous vaginal delivery Postpartum Procedures: none Complications-Operative and Postpartum: none Hemoglobin  Date Value Range Status  02/13/2012 9.7* 12.0 - 15.0 g/dL Final     DELTA CHECK NOTED     REPEATED TO VERIFY     HCT  Date Value Range Status  02/13/2012 29.2* 36.0 - 46.0 % Final  Admission Hgb 12.1.  Physical Exam:  General: alert, cooperative and no distress Heart:  RRR Lungs:  CTA bilat Breasts:  Soft Abd:  Soft, NT with pos BS x 4 quads. Lochia: appropriate Uterine Fundus: firm Incision: N/A-Perineum intact DVT Evaluation: No evidence of DVT seen on physical exam. Negative Homan's sign bilat. No significant calf/ankle edema.  Discharge Diagnoses: Term Pregnancy-delivered and asymptomatic anemia  Discharge Information: Date: 02/14/2012 Activity: unrestricted Diet: routine Medications: PNV, Ibuprofen, Iron and Percocet Condition: stable Instructions: refer to practice specific booklet Discharge to: home Follow-up Information    Follow up with [facility]. In 6 weeks.   Contact information:   [address] [place] [address]        Contraception:  Plans Implanon  Newborn Data: Live born female  Birth Weight: 7 lb 4.8 oz (3310 g) APGAR: 9, 9  Home with mother.  Joseh Sjogren O. 02/14/2012, 12:13 PM

## 2012-02-19 ENCOUNTER — Encounter: Payer: Medicaid Other | Admitting: *Deleted

## 2012-02-23 ENCOUNTER — Ambulatory Visit (HOSPITAL_COMMUNITY)
Admit: 2012-02-23 | Discharge: 2012-02-23 | Disposition: A | Discharge status: Home / Self Care | Payer: Medicaid Other | Attending: Obstetrics and Gynecology | Admitting: Obstetrics and Gynecology

## 2012-02-23 NOTE — Progress Notes (Signed)
Adult Lactation Consultation Outpatient Visit Note  Patient Name: Diane Kent                                              Baby Girl Mikayla DOB [birth year] Birth Weight 7 lb. 4 oz. Date of Birth: January 21, 1991                                                             Now 7 days old Gestational Age at Delivery: Unknown Type of Delivery: SVB  Breastfeeding History: Frequency of Breastfeeding: Mom stopped BF last Thursday or Friday Length of Feeding:  Voids: 8-12/day Stools: 8/day mustard color  Supplementing / Method: Pumping:  Type of Pump:  Lactina from [facility]   Frequency:  Every 3 hours for 10-15 minutes  Volume:  4-6 from each breast  Comments: Mom was started on the nipple shield in the hospital due to difficult latch. Milk came in on Sunday, Jan 12th. On Monday, Mom started pumping and supplementing with EBM because baby was frustrated at the breast. Micah Flesher to Peds on Tuesday, was advised to continue to supplement due to weight loss, weight had decreased to 7 lb. 0 oz. and baby was jaundiced. Mom has been supplementing every 3 hours with 2-3 oz of EBM via bottle. She stopped putting the baby to the breast on Thursday or Friday of last week because the baby became too frustrated trying to latch.  Mom would like to get baby back to the breast at least at night. The last feeding the baby had today was at 0830 2 oz. Of EBM. Baby has gained 9.9 oz in the past 6 days.    Consultation Evaluation: Mom has copious amounts of breast milk present. Had Mom massage and hand express left breast before attempting to latch her baby. It took few attempts and demonstrating to Mom how to sandwich breast for baby to latch, but once the baby latched she demonstrated a good suckling rhythm with swallows audible. Mom denied any discomfort at the breast. With the right breast, again Mom massaged and hand expressed, after few attempts the baby latched with sandwiching of Mom's breast, baby was sleepy at the breast but  did demonstrate a good suckling rhythm with swallows audible. Mom was very pleased.   Initial Feeding Assessment: Pre-feed Weight:  7 lb. 9.9 oz/ 3454 gm Post-feed Weight:  7 lb. 10.8 oz/ 3480 gm Amount Transferred:  26 ml. Comments:  From left breast with nursing for 13 minutes  Additional Feeding Assessment: Pre-feed Weight:  7 lb. 10.8 oz/3480 gm Post-feed Weight:  7 lb. 11.2 oz/3492 gm Amount Transferred:  12 ml. Comments:  From right breast with nursing for 8 minutes.   Additional Feeding Assessment: Pre-feed Weight: Post-feed Weight: Amount Transferred: Comments:  Total Breast milk Transferred this Visit: 38 ml. Total Supplement Given: None, baby had recently fed taking 2 oz of EBM at 0830 via bottle as was satiated after the feeding at this visit.  Additional Interventions: Encouraged Mom to breastfeed with every feeding whenever she observes feeding ques. If she does not observe feeding ques by 2 hours from the last feeding, put the baby STS  and attempt to breast feed before the baby becomes too hungry to work with her at the breast. If Mom's breast soften after the feeding and the baby is satisfied, then Mom does not need to supplement. If the baby is not satisfied and her breasts are not soft then post pump and give the baby 30-60 ml of EBM as supplement. Continue to monitor voids/stools. Discussed the importance of frequent feedings or pumping to protect milk supply. Follow up next week for feeding assessment.   Follow-Up  Monday, March 01, 2012 at 0900    Alfred Levins 02/23/2012, 9:07 AM

## 2012-03-01 ENCOUNTER — Ambulatory Visit (HOSPITAL_COMMUNITY): Admission: RE | Admit: 2012-03-01 | Payer: Medicaid Other | Source: Ambulatory Visit

## 2012-03-09 ENCOUNTER — Telehealth: Payer: Self-pay | Admitting: Obstetrics and Gynecology

## 2012-03-09 ENCOUNTER — Ambulatory Visit: Payer: Medicaid Other | Admitting: Obstetrics and Gynecology

## 2012-03-09 ENCOUNTER — Encounter: Payer: Self-pay | Admitting: Obstetrics and Gynecology

## 2012-03-09 VITALS — BP 100/62 | Wt 152.0 lb

## 2012-03-09 DIAGNOSIS — M545 Low back pain: Secondary | ICD-10-CM

## 2012-03-09 MED ORDER — CYCLOBENZAPRINE HCL 10 MG PO TABS: 10.0000 mg | ORAL_TABLET | Freq: Three times a day (TID) | ORAL | Status: AC | PRN

## 2012-03-09 NOTE — Addendum Note (Signed)
Addended by: Janine Limbo on: 03/09/2012 05:58 PM   Modules accepted: Orders

## 2012-03-09 NOTE — Progress Notes (Signed)
HISTORY OF PRESENT ILLNESS  [name] is a 22 y.o. year old female,G1P1001, who presents for a problem visit. The patient had a vaginal delivery on February 12, 2012.  She denies a history of pelvic trauma.  Subjective:  The patient complains of pain in her lower back and hips said she delivered her baby.  She says that ibuprofen is no longer helping with her discomfort.  Objective:  BP 100/62  Wt 152 lb (68.947 kg)   GI: soft and nontender back: no CVA tenderness Extremities: Normal range of motion and normal strength.  Exam deferred.  Assessment:  Back and hip pain after vaginal delivery.  No evidence of trauma or broken bones.  Plan:  Patient was told to continue to take ibuprofen and try heat therapy.  I will call in Flexeril 10 mg every 8 hours as needed.  The patient is encouraged to continue stretching exercises.  She is to return in 2 weeks.  If there is no improvement and then we may refer her to a physical therapist.  Leonard Schwartz M.D.  03/09/2012 5:40 PM

## 2012-03-23 ENCOUNTER — Encounter: Payer: Self-pay | Admitting: Obstetrics and Gynecology

## 2012-03-23 ENCOUNTER — Ambulatory Visit: Payer: Medicaid Other | Admitting: Obstetrics and Gynecology

## 2012-03-23 NOTE — Progress Notes (Signed)
Diane Kent is a 22 y.o. female who presents for a postpartum visit.   Type of delivery:  SVB by VS, no lacerations  Patient reports dojng well physically--has elected to separate from FOB, since he was "stressing" her out.  Patient living with her parents, which is a very supportive situation for patient.    Hx remarkable for: Patient Active Problem List  Diagnosis  . Anemia     PPDS = 14, but denies pp depression, SI or HI--just felt stressed out by FOB, but she is "fine" when he is not around.  Denies domestic violence. Does not feel any need for meds, referral, or f/u.  Contraception plan:  Wants Nexplanon.   I have fully reviewed the prenatal and intrapartum course   Patient has not been sexually active since delivery.   The following portions of the patient's history were reviewed and updated as appropriate: allergies, current medications, past family history, past medical history, past social history, past surgical history and problem list.  Review of Systems Pertinent items are noted in HPI.   Objective:    BP 100/62  Wt 151 lb (68.493 kg)  BMI 29.49 kg/m2  Breastfeeding? Yes  General:  alert, cooperative and no distress     Lungs: clear to auscultation bilaterally  Heart:  regular rate and rhythm, S1, S2 normal, no murmur  Abdomen: soft, non-tender; bowel sounds normal; no masses,  no organomegaly.   Incision:  NA   Vulva:  normal  Vagina: normal vagina  Cervix:  normal  Uterus: normal size, contour, position, consistency, mobility, non-tender, well-involuted  Adnexa:  normal adnexa             Assessment:     Normal postpartum exam.  Pap smear:   not done at today's visit.   Done 09/2011--due 2015. Relationship issues, but no domestic violence  Plan:  Follow-up ASAP for Nexplanon Support to patient for issues with FOB--she feels she has adequate support from her family.  Nigel Bridgeman CNM, [place] 03/23/2012 10:17 AM

## 2012-03-23 NOTE — Progress Notes (Signed)
Date of delivery: 02/12/2012 Female Name: Diane Kent Vaginal delivery:yes Cesarean section:no Tubal ligation:no GDM:no Breast Feeding:yes Bottle Feeding:yes Post-Partum Blues:yes Abnormal pap:no Normal GU function: yes Normal GI function:yes Returning to work:no EPDS: 14 Pt stated she wants to get nexplanon .

## 2012-03-24 ENCOUNTER — Telehealth: Payer: Self-pay | Admitting: Obstetrics and Gynecology

## 2012-03-24 NOTE — Telephone Encounter (Signed)
Pt called back and told disregard phone call. There is no documentation of pt being called  Pt voiced understanding.  [facility] CMA

## 2012-04-02 ENCOUNTER — Ambulatory Visit: Payer: Medicaid Other | Admitting: Family Medicine

## 2012-04-02 ENCOUNTER — Encounter: Payer: Self-pay | Admitting: Family Medicine

## 2012-04-02 VITALS — BP 90/68 | Resp 18 | Wt 149.0 lb

## 2012-04-02 DIAGNOSIS — IMO0001 Reserved for inherently not codable concepts without codable children: Secondary | ICD-10-CM

## 2012-04-02 DIAGNOSIS — Z3202 Encounter for pregnancy test, result negative: Secondary | ICD-10-CM

## 2012-04-02 DIAGNOSIS — Z30017 Encounter for initial prescription of implantable subdermal contraceptive: Secondary | ICD-10-CM

## 2012-04-02 MED ORDER — ETONOGESTREL 68 MG ~~LOC~~ IMPL
68.0000 mg | DRUG_IMPLANT | Freq: Once | SUBCUTANEOUS | Status: AC
  Administered 2012-04-02: 68 mg via SUBCUTANEOUS

## 2012-04-02 NOTE — Patient Instructions (Addendum)

## 2012-04-02 NOTE — Progress Notes (Signed)
S: Patient presents today for Nexplanon placement.  Understand SE.  O: LMP: 2/18, No intercourse in last 14 days, discussed MOA/ irregular menses at length and effects on mood (pt has history of depression).        Nexplanon placed in left arm under sterile technique and office protocol without difficulty.  Patient tolerated well.  Dr. Normand Sloop at bedside for observation.    A: Nexplanon     UPT negative  P: Continue nexplanon x 3 years, condoms/foam/film for back up x 2 weeks.  Discussed dressing changes.      F/U in office in 1 week.

## 2012-04-06 ENCOUNTER — Ambulatory Visit: Payer: Medicaid Other | Admitting: Physical Therapy

## 2012-04-09 ENCOUNTER — Encounter: Payer: Medicaid Other | Admitting: Family Medicine

## 2012-04-13 ENCOUNTER — Ambulatory Visit: Payer: Medicaid Other | Attending: Rehabilitation | Admitting: Rehabilitation

## 2012-04-21 ENCOUNTER — Ambulatory Visit: Payer: Medicaid Other | Admitting: Physical Therapy

## 2013-03-27 ENCOUNTER — Encounter (HOSPITAL_COMMUNITY): Payer: Self-pay | Admitting: Emergency Medicine

## 2013-03-27 ENCOUNTER — Emergency Department (INDEPENDENT_AMBULATORY_CARE_PROVIDER_SITE_OTHER)
Admission: EM | Admit: 2013-03-27 | Discharge: 2013-03-27 | Disposition: A | Discharge status: Home / Self Care | Payer: Medicaid Other | Source: Home / Self Care | Attending: Family Medicine | Admitting: Family Medicine

## 2013-03-27 DIAGNOSIS — K047 Periapical abscess without sinus: Secondary | ICD-10-CM

## 2013-03-27 MED ORDER — CLINDAMYCIN HCL 300 MG PO CAPS: 300.0000 mg | ORAL_CAPSULE | Freq: Four times a day (QID) | ORAL | Status: DC

## 2013-03-27 MED ORDER — FLUCONAZOLE 150 MG PO TABS: 150.0000 mg | ORAL_TABLET | Freq: Once | ORAL | Status: DC

## 2013-03-27 MED ORDER — TRAMADOL HCL 50 MG PO TABS: 50.0000 mg | ORAL_TABLET | Freq: Four times a day (QID) | ORAL | Status: DC | PRN

## 2013-03-27 NOTE — Discharge Instructions (Signed)
Thank you for coming in today. Take clindamycin 3 times daily. His tramadol for severe pain. Take up to 2 Aleve twice daily for regular pain. Stop amoxicillin. Followup with the oral surgeon  Dental Abscess A dental abscess is a collection of infected fluid (pus) from a bacterial infection in the inner part of the tooth (pulp). It usually occurs at the end of the tooth's root.  CAUSES   Severe tooth decay.  Trauma to the tooth that allows bacteria to enter into the pulp, such as a broken or chipped tooth. SYMPTOMS   Severe pain in and around the infected tooth.  Swelling and redness around the abscessed tooth or in the mouth or face.  Tenderness.  Pus drainage.  Bad breath.  Bitter taste in the mouth.  Difficulty swallowing.  Difficulty opening the mouth.  Nausea.  Vomiting.  Chills.  Swollen neck glands. DIAGNOSIS   A medical and dental history will be taken.  An examination will be performed by tapping on the abscessed tooth.  X-rays may be taken of the tooth to identify the abscess. TREATMENT The goal of treatment is to eliminate the infection. You may be prescribed antibiotic medicine to stop the infection from spreading. A root canal may be performed to save the tooth. If the tooth cannot be saved, it may be pulled (extracted) and the abscess may be drained.  HOME CARE INSTRUCTIONS  Only take over-the-counter or prescription medicines for pain, fever, or discomfort as directed by your caregiver.  Rinse your mouth (gargle) often with salt water ( tsp salt in 8 oz [250 ml] of warm water) to relieve pain or swelling.  Do not drive after taking pain medicine (narcotics).  Do not apply heat to the outside of your face.  Return to your dentist for further treatment as directed. SEEK MEDICAL CARE IF:  Your pain is not helped by medicine.  Your pain is getting worse instead of better. SEEK IMMEDIATE MEDICAL CARE IF:  You have a fever or persistent  symptoms for more than 2 3 days.  You have a fever and your symptoms suddenly get worse.  You have chills or a very bad headache.  You have problems breathing or swallowing.  You have trouble opening your mouth.  You have swelling in the neck or around the eye. Document Released: 01/20/2005 Document Revised: 10/15/2011 Document Reviewed: 04/30/2010 [facility] Patient Information 2014 [place], [place].

## 2013-03-27 NOTE — ED Notes (Signed)
C/o right sided facial swelling due to dental problem.  Pt was seen by dentist and started antibiotics and was to see oral surgeon on tues day but office was closed.  Pt did complete first dose of antibiotic and now has started second rx of amoxicillin but swelling is not going down symptoms seem to be getting worse.  Pt is scheduled on 2/24 to see surgeon.

## 2013-03-27 NOTE — ED Provider Notes (Signed)
Diane Kent is a 23 y.o. female who presents to Urgent Care today for dental pain and facial swelling. Patient has a painful tooth that is scheduled for extraction with oral surgery. This was supposed to happen last week however it was canceled due to snow. Her consultation appointment is set for February 24. She has worsening swelling and pain. She has been on antibiotics in the past. She currently taking amoxicillin which is not helping.  No fevers or chills nausea vomiting or diarrhea. No trouble breathing. Not currently breast-feeding.   Past Medical History  Diagnosis Date  . Pneumonia     AS CHILD  . Stomach disease 2009    NO MEDS CURRENTLY  . Anxiety 2009    ON MEDS X 1 YEAR  . Infection 02/2011    UTI  . Yeast infection   . H/O bacterial infection   . History of chlamydia   . Anemia   . Depression 2009    in HS, ok now   History  Substance Use Topics  . Smoking status: Former Smoker -- 1.00 packs/day    Quit date: 06/28/2011  . Smokeless tobacco: Never Used  . Alcohol Use: 0.5 oz/week    1 drink(s) per week     Comment: social alcohol before pregnancy   ROS as above Medications: No current facility-administered medications for this encounter.   Current Outpatient Prescriptions  Medication Sig Dispense Refill  . acetaminophen (TYLENOL) 500 MG tablet Take 1,000 mg by mouth every 6 (six) hours as needed. Takes for pain      . ibuprofen (ADVIL,MOTRIN) 600 MG tablet Take 1 tablet (600 mg total) by mouth every 6 (six) hours.  30 tablet  1  . calcium carbonate (TUMS EX) 750 MG chewable tablet Chew 1 tablet by mouth daily as needed. heartburn      . clindamycin (CLEOCIN) 300 MG capsule Take 1 capsule (300 mg total) by mouth every 6 (six) hours.  40 capsule  0  . ferrous sulfate 325 (65 FE) MG tablet Take 1 tablet (325 mg total) by mouth at bedtime.  30 tablet  1  . fluconazole (DIFLUCAN) 150 MG tablet Take 1 tablet (150 mg total) by mouth once.  1 tablet  1  . Prenatal  Vit-Fe Fumarate-FA (PRENATAL MULTIVITAMIN) TABS Take 1 tablet by mouth at bedtime.      . traMADol (ULTRAM) 50 MG tablet Take 1 tablet (50 mg total) by mouth every 6 (six) hours as needed.  15 tablet  0    Exam:  BP 124/77  Pulse 85  Temp(Src) 98.7 F (37.1 C) (Oral)  Resp 16  SpO2 100% Gen: Well NAD HEENT: EOMI,  MMM diffuse right facial swelling. Gum line of bicuspid on right lower erythematous with fluctuant area with pus expressible. Tender to touch Lungs: Normal work of breathing. CTABL Heart: RRR no MRG Abd: NABS, Soft. NT, ND Exts: Brisk capillary refill, warm and well perfused.   Dental injection and incision and drainage of dental abscess. Consent obtained Topical numbing medicine applied to the base of the tooth 1.8 mL of Marcaine and epinephrine were injected into the base of the tooth at the junction of the gum and cheek achieving good anesthesia. An 11 blade scalpel was used to lance the abscess a small amount of pus was expressed. Patient tolerated the procedure well   Assessment and Plan: 23 y.o. female with dental pain secondary to abscess.  Abscess incised and drained. Plan to use clindamycin antibiotics  and tramadol for pain. Followup with oral surgeon as soon as possible  Discussed warning signs or symptoms. Please see discharge instructions. Patient expresses understanding.    Rodolph BongEvan S Alecsander Hattabaugh, MD 03/27/13 1350

## 2013-12-05 ENCOUNTER — Encounter (HOSPITAL_COMMUNITY): Payer: Self-pay | Admitting: Emergency Medicine

## 2015-01-10 ENCOUNTER — Emergency Department (HOSPITAL_COMMUNITY): Payer: Medicaid Other

## 2015-01-10 ENCOUNTER — Emergency Department (HOSPITAL_COMMUNITY)
Admission: EM | Admit: 2015-01-10 | Discharge: 2015-01-10 | Disposition: A | Discharge status: Home / Self Care | Payer: Medicaid Other | Attending: Emergency Medicine | Admitting: Emergency Medicine

## 2015-01-10 ENCOUNTER — Encounter (HOSPITAL_COMMUNITY): Payer: Self-pay | Admitting: Emergency Medicine

## 2015-01-10 DIAGNOSIS — R112 Nausea with vomiting, unspecified: Secondary | ICD-10-CM

## 2015-01-10 DIAGNOSIS — K529 Noninfective gastroenteritis and colitis, unspecified: Secondary | ICD-10-CM | POA: Diagnosis not present

## 2015-01-10 DIAGNOSIS — Z79899 Other long term (current) drug therapy: Secondary | ICD-10-CM | POA: Diagnosis not present

## 2015-01-10 DIAGNOSIS — Z3202 Encounter for pregnancy test, result negative: Secondary | ICD-10-CM | POA: Diagnosis not present

## 2015-01-10 DIAGNOSIS — Z87440 Personal history of urinary (tract) infections: Secondary | ICD-10-CM | POA: Diagnosis not present

## 2015-01-10 DIAGNOSIS — Z87891 Personal history of nicotine dependence: Secondary | ICD-10-CM | POA: Insufficient documentation

## 2015-01-10 DIAGNOSIS — Z8619 Personal history of other infectious and parasitic diseases: Secondary | ICD-10-CM | POA: Insufficient documentation

## 2015-01-10 DIAGNOSIS — Z8701 Personal history of pneumonia (recurrent): Secondary | ICD-10-CM | POA: Diagnosis not present

## 2015-01-10 DIAGNOSIS — D649 Anemia, unspecified: Secondary | ICD-10-CM | POA: Insufficient documentation

## 2015-01-10 DIAGNOSIS — R1033 Periumbilical pain: Secondary | ICD-10-CM

## 2015-01-10 DIAGNOSIS — Z8659 Personal history of other mental and behavioral disorders: Secondary | ICD-10-CM | POA: Insufficient documentation

## 2015-01-10 DIAGNOSIS — R109 Unspecified abdominal pain: Secondary | ICD-10-CM | POA: Diagnosis present

## 2015-01-10 DIAGNOSIS — R197 Diarrhea, unspecified: Secondary | ICD-10-CM

## 2015-01-10 LAB — COMPREHENSIVE METABOLIC PANEL
ALK PHOS: 51 U/L (ref 38–126)
ALT: 16 U/L (ref 14–54)
AST: 25 U/L (ref 15–41)
Albumin: 4.7 g/dL (ref 3.5–5.0)
Anion gap: 8 (ref 5–15)
BILIRUBIN TOTAL: 0.9 mg/dL (ref 0.3–1.2)
BUN: 6 mg/dL (ref 6–20)
CALCIUM: 9.3 mg/dL (ref 8.9–10.3)
CO2: 27 mmol/L (ref 22–32)
Chloride: 105 mmol/L (ref 101–111)
Creatinine, Ser: 0.7 mg/dL (ref 0.44–1.00)
GLUCOSE: 113 mg/dL — AB (ref 65–99)
Potassium: 3.5 mmol/L (ref 3.5–5.1)
Sodium: 140 mmol/L (ref 135–145)
TOTAL PROTEIN: 8 g/dL (ref 6.5–8.1)

## 2015-01-10 LAB — URINALYSIS, ROUTINE W REFLEX MICROSCOPIC
BILIRUBIN URINE: NEGATIVE
Glucose, UA: NEGATIVE mg/dL
Hgb urine dipstick: NEGATIVE
KETONES UR: 15 mg/dL — AB
NITRITE: NEGATIVE
PROTEIN: 100 mg/dL — AB
Specific Gravity, Urine: 1.028 (ref 1.005–1.030)
pH: 8.5 — ABNORMAL HIGH (ref 5.0–8.0)

## 2015-01-10 LAB — CBC
HCT: 41.1 % (ref 36.0–46.0)
Hemoglobin: 13.8 g/dL (ref 12.0–15.0)
MCH: 30.2 pg (ref 26.0–34.0)
MCHC: 33.6 g/dL (ref 30.0–36.0)
MCV: 89.9 fL (ref 78.0–100.0)
Platelets: 393 10*3/uL (ref 150–400)
RBC: 4.57 MIL/uL (ref 3.87–5.11)
RDW: 12.9 % (ref 11.5–15.5)
WBC: 10.1 10*3/uL (ref 4.0–10.5)

## 2015-01-10 LAB — LIPASE, BLOOD: Lipase: 23 U/L (ref 11–51)

## 2015-01-10 LAB — URINE MICROSCOPIC-ADD ON

## 2015-01-10 LAB — HCG, QUANTITATIVE, PREGNANCY: hCG, Beta Chain, Quant, S: 1 m[IU]/mL (ref <5)

## 2015-01-10 LAB — PREGNANCY, URINE: Preg Test, Ur: NEGATIVE

## 2015-01-10 MED ORDER — HYDROCODONE-ACETAMINOPHEN 5-325 MG PO TABS
1.0000 | ORAL_TABLET | Freq: Once | ORAL | Status: AC
  Administered 2015-01-10: 1 via ORAL
  Filled 2015-01-10: qty 1

## 2015-01-10 MED ORDER — ONDANSETRON 4 MG PO TBDP
4.0000 mg | ORAL_TABLET | Freq: Once | ORAL | Status: AC | PRN
  Administered 2015-01-10: 4 mg via ORAL

## 2015-01-10 MED ORDER — HYDROCODONE-ACETAMINOPHEN 5-325 MG PO TABS: 1.0000 | ORAL_TABLET | Freq: Four times a day (QID) | ORAL | Status: DC | PRN

## 2015-01-10 MED ORDER — OXYCODONE-ACETAMINOPHEN 5-325 MG PO TABS
1.0000 | ORAL_TABLET | Freq: Once | ORAL | Status: AC
  Administered 2015-01-10: 1 via ORAL

## 2015-01-10 MED ORDER — ONDANSETRON HCL 4 MG/2ML IJ SOLN
4.0000 mg | Freq: Once | INTRAMUSCULAR | Status: AC
  Administered 2015-01-10: 4 mg via INTRAVENOUS
  Filled 2015-01-10: qty 2

## 2015-01-10 MED ORDER — SODIUM CHLORIDE 0.9 % IV BOLUS (SEPSIS)
1000.0000 mL | Freq: Once | INTRAVENOUS | Status: AC
  Administered 2015-01-10: 1000 mL via INTRAVENOUS

## 2015-01-10 MED ORDER — OXYCODONE-ACETAMINOPHEN 5-325 MG PO TABS
1.0000 | ORAL_TABLET | Freq: Once | ORAL | Status: AC
  Administered 2015-01-10: 1 via ORAL
  Filled 2015-01-10: qty 1

## 2015-01-10 MED ORDER — ONDANSETRON 4 MG PO TBDP
ORAL_TABLET | ORAL | Status: AC
  Filled 2015-01-10: qty 1

## 2015-01-10 MED ORDER — IOHEXOL 300 MG/ML  SOLN: 25.0000 mL | Freq: Once | INTRAMUSCULAR | Status: DC | PRN

## 2015-01-10 MED ORDER — ONDANSETRON HCL 4 MG PO TABS: 4.0000 mg | ORAL_TABLET | Freq: Four times a day (QID) | ORAL | Status: DC

## 2015-01-10 MED ORDER — IOHEXOL 300 MG/ML  SOLN
80.0000 mL | Freq: Once | INTRAMUSCULAR | Status: AC | PRN
  Administered 2015-01-10: 80 mL via INTRAVENOUS

## 2015-01-10 MED ORDER — OXYCODONE-ACETAMINOPHEN 5-325 MG PO TABS
ORAL_TABLET | ORAL | Status: AC
  Filled 2015-01-10: qty 1

## 2015-01-10 NOTE — ED Notes (Signed)
PA at bedside.

## 2015-01-10 NOTE — ED Notes (Signed)
All supplies at bedside to perform IV start and medication administration.  Pt on cell phone.  Asked RN to return, will call when ready for procedure

## 2015-01-10 NOTE — ED Notes (Signed)
Spoke to NeolaRia in lab who states they will add a urine pregnancy on for patient.

## 2015-01-10 NOTE — ED Provider Notes (Signed)
CSN: [id number]   Arrival date & time 01/10/15 1736  History  By signing my name below, I, Bethel Born, attest that this documentation has been prepared under the direction and in the presence of Rolm Gala Ricquel Foulk PA-C Electronically Signed: Bethel Born, ED Scribe. 01/10/2015. 8:27 PM. Chief Complaint  Patient presents with  . Abdominal Pain    HPI The history is provided by the patient. No language interpreter was used.   Diane Kent is a 24 y.o. female who presents to the Emergency Department complaining of recurrent, constant, cramping, 5/10 in severity, mid-abdominal pain with sudden onset this afternoon around 4:30 PM.  The pain worsened after using Tums and she notes that deep breathing causes her stomach to "stiffen up". She has not taken OTC pain medications. Pt states that she had similar pain in 2009 and that she was worked up for by GI and it temporarily resolved after diet changes. Since then she had similar pain episodes in 2014 and 2 months ago. 2 months ago she was seen at [facility], had a CT scan and told that she had an infection in the colon. At that time she was treated with abx and pain medication that temporarily improved her symptoms. Associated symptoms include feeling hot, nausea, 1 episode of emesis, and 1 episode of diarrhea. In the past vomiting has provided some relief in pain but did not relive her symptoms today. Pt denies fever, chills, headache, dizziness, syncope, chest pain, SOB, dysuria or flank pain.   Past Medical History  Diagnosis Date  . Pneumonia     AS CHILD  . Stomach disease 2009    NO MEDS CURRENTLY  . Anxiety 2009    ON MEDS X 1 YEAR  . Infection 02/2011    UTI  . Yeast infection   . H/O bacterial infection   . History of chlamydia   . Anemia   . Depression 2009    in HS, ok now    Past Surgical History  Procedure Laterality Date  . Wisdom tooth extraction  2007  . Tonsillectomy  2009    T&A    Family History  Problem Relation  Age of Onset  . Lupus Maternal Aunt   . Hypertension Paternal Aunt   . Fibromyalgia Paternal Aunt   . Mental illness Paternal Aunt   . Mental illness Paternal Uncle   . Hypertension Paternal Uncle   . Arthritis Maternal Grandmother   . Hyperlipidemia Maternal Grandmother   . Hypertension Maternal Grandmother   . Hyperlipidemia Paternal Grandmother   . Hypertension Paternal Grandmother   . Diabetes Paternal Grandfather   . Hyperlipidemia Paternal Grandfather   . Hypertension Paternal Grandfather   . Asthma Other   . Birth defects Maternal Uncle   . Other Neg Hx     Social History  Substance Use Topics  . Smoking status: Former Smoker -- 1.00 packs/day    Quit date: 06/28/2011  . Smokeless tobacco: Never Used  . Alcohol Use: 0.5 oz/week    1 drink(s) per week     Comment: social alcohol before pregnancy     Review of Systems  Gastrointestinal: Positive for nausea, vomiting, abdominal pain and diarrhea.  All other systems reviewed and are negative.  Home Medications   Prior to Admission medications   Medication Sig Start Date End Date Taking? Authorizing Provider  acetaminophen (TYLENOL) 500 MG tablet Take 1,000 mg by mouth every 6 (six) hours as needed. Takes for pain    Historical Provider,  MD  calcium carbonate (TUMS EX) 750 MG chewable tablet Chew 1 tablet by mouth daily as needed. heartburn    Historical Provider, MD  clindamycin (CLEOCIN) 300 MG capsule Take 1 capsule (300 mg total) by mouth every 6 (six) hours. 03/27/13   Rodolph Bong, MD  ferrous sulfate 325 (65 FE) MG tablet Take 1 tablet (325 mg total) by mouth at bedtime. 02/14/12   Elsie Ra, CNM  fluconazole (DIFLUCAN) 150 MG tablet Take 1 tablet (150 mg total) by mouth once. 03/27/13   Rodolph Bong, MD  HYDROcodone-acetaminophen (NORCO/VICODIN) 5-325 MG tablet Take 1 tablet by mouth every 6 (six) hours as needed for moderate pain. 01/10/15   Tiara Maultsby, PA-C  ibuprofen (ADVIL,MOTRIN) 600 MG tablet Take 1 tablet  (600 mg total) by mouth every 6 (six) hours. 02/14/12   Elsie Ra, CNM  ondansetron (ZOFRAN) 4 MG tablet Take 1 tablet (4 mg total) by mouth every 6 (six) hours. 01/10/15   Rolm Gala Glender [place], PA-C  Prenatal Vit-Fe Fumarate-FA (PRENATAL MULTIVITAMIN) TABS Take 1 tablet by mouth at bedtime.    Historical Provider, MD  traMADol (ULTRAM) 50 MG tablet Take 1 tablet (50 mg total) by mouth every 6 (six) hours as needed. 03/27/13   Rodolph Bong, MD    Allergies  Review of patient's allergies indicates no known allergies.  Triage Vitals: BP 154/100 mmHg  Pulse 94  Temp(Src) 98.3 F (36.8 C) (Oral)  Resp 16  Ht  (1.6 m)  Wt 150 lb (68.04 kg)  BMI 26.58 kg/m2  SpO2 100%  Physical Exam  Constitutional: She appears well-developed and well-nourished. No distress.  Nontoxic appearing  HENT:  Head: Normocephalic and atraumatic.  Right Ear: External ear normal.  Left Ear: External ear normal.  Eyes: Conjunctivae are normal. Right eye exhibits no discharge. Left eye exhibits no discharge. No scleral icterus.  Neck: Normal range of motion.  Cardiovascular: Normal rate and normal heart sounds.   Pulmonary/Chest: Effort normal and breath sounds normal. No respiratory distress. She has no wheezes. She has no rales.  Abdominal: Soft. There is tenderness in the periumbilical area. There is no rigidity, no rebound, no guarding, no tenderness at McBurney's point and negative Murphy's sign.    Musculoskeletal: Normal range of motion. She exhibits no edema or tenderness.  Moves all extremities spontaneously and walks with a steady gait  Neurological: She is alert. Coordination normal.  Skin: Skin is warm and dry.  Psychiatric: She has a normal mood and affect. Her behavior is normal.  Nursing note and vitals reviewed.   ED Course  Procedures  DIAGNOSTIC STUDIES: Oxygen Saturation is 100% on RA,  normal by my interpretation.    COORDINATION OF CARE: 8:23 PM Discussed treatment plan which includes  lab work, CT A/P with pt at bedside and pt agreed to the plan.  Labs Review-  Labs Reviewed  COMPREHENSIVE METABOLIC PANEL - Abnormal; Notable for the following:    Glucose, Bld 113 (*)    All other components within normal limits  URINALYSIS, ROUTINE W REFLEX MICROSCOPIC (NOT AT [facility]) - Abnormal; Notable for the following:    Color, Urine AMBER (*)    APPearance TURBID (*)    pH 8.5 (*)    Ketones, ur 15 (*)    Protein, ur 100 (*)    Leukocytes, UA SMALL (*)    All other components within normal limits  URINE MICROSCOPIC-ADD ON - Abnormal; Notable for the following:    Squamous Epithelial /  LPF 0-5 (*)    Bacteria, UA FEW (*)    Casts GRANULAR CAST (*)    All other components within normal limits  LIPASE, BLOOD  CBC  HCG, QUANTITATIVE, PREGNANCY  PREGNANCY, URINE    Imaging Review Ct Abdomen Pelvis W Contrast  01/10/2015  CLINICAL DATA:  24 year old female with mid abdominal pain, nausea and vomiting EXAM: CT ABDOMEN AND PELVIS WITH CONTRAST TECHNIQUE: Multidetector CT imaging of the abdomen and pelvis was performed using the standard protocol following bolus administration of intravenous contrast. CONTRAST:  80mL OMNIPAQUE IOHEXOL 300 MG/ML  SOLN COMPARISON:  None. FINDINGS: The visualized lung bases are clear. No intra-abdominal free air or free fluid. The liver, gallbladder, pancreas, spleen, adrenal glands, kidneys, visualized ureters, and urinary bladder appear unremarkable. Sixty the uterus and the ovaries appear grossly unremarkable. There is no evidence of bowel obstruction or inflammation. Normal appendix. There is mild nonspecific mesenteric stranding. Clinical correlation is recommended to evaluate for enteritis. The abdominal aorta and IVC appear patent. No portal venous gas identified. There is no lymphadenopathy. There is a small fat containing umbilical hernia. The soft tissues of the abdominal wall appear unremarkable. The osseous structures are intact. IMPRESSION: Mild  nonspecific diffuse was draped stranding. Correlation with clinical exam recommended to evaluate for enteritis. No evidence of bowel obstruction. Normal appendix. Electronically Signed   By: Elgie CollardArash  Radparvar M.D.   On: 01/10/2015 22:58    MDM   Final diagnoses:  Periumbilical abdominal pain  Nausea and vomiting, vomiting of unspecified type  Diarrhea, unspecified type  Gastroenteritis   24 year old female presenting with periumbilical abdominal pain, nausea, vomiting and diarrhea 1 day. She reports similar episodes in the past, most frequently 2 months ago. She reports having "an infection of the colon" and needing antibiotics at that time. Vital signs stable. Patient is nontoxic-appearing. There is periumbilical abdominal tenderness without peritoneal signs. Nausea managed with Zofran. Pain managed with Vicodin. Fluid bolus given. Abdominal CT results for enteritis. Case discussed with Dr. Estell HarpinZammit who viewed the CT results and recommends outpatient symptomatic treatment and follow up with GI. Will discharge pt with zofran and short course of pain medications  and given strict instructions for follow-up with their primary care physician. Also given referral to GI.  I have also discussed reasons to return immediately to the ER.  Patient expresses understanding and agrees with plan.  I personally performed the services described in this documentation, which was scribed in my presence. The recorded information has been reviewed and is accurate.    Alveta HeimlichStevi Gjon Letarte, PA-C 01/10/15 2245  Don Giarrusso, PA-C 01/10/15 [id number]  Bethann BerkshireJoseph Zammit, MD 01/11/15 [phone]

## 2015-01-10 NOTE — ED Notes (Signed)
Pt up to restroom.  Gait steady and even.  Able to ambulate independently

## 2015-01-10 NOTE — Discharge Instructions (Signed)
Abdominal Pain, Adult °Many things can cause abdominal pain. Usually, abdominal pain is not caused by a disease and will improve without treatment. It can often be observed and treated at home. Your health care provider will do a physical exam and possibly order blood tests and X-rays to help determine the seriousness of your pain. However, in many cases, more time must pass before a clear cause of the pain can be found. Before that point, your health care provider may not know if you need more testing or further treatment. °HOME CARE INSTRUCTIONS °Monitor your abdominal pain for any changes. The following actions may help to alleviate any discomfort you are experiencing: °· Only take over-the-counter or prescription medicines as directed by your health care provider. °· Do not take laxatives unless directed to do so by your health care provider. °· Try a clear liquid diet (broth, tea, or water) as directed by your health care provider. Slowly move to a bland diet as tolerated. °SEEK MEDICAL CARE IF: °· You have unexplained abdominal pain. °· You have abdominal pain associated with nausea or diarrhea. °· You have pain when you urinate or have a bowel movement. °· You experience abdominal pain that wakes you in the night. °· You have abdominal pain that is worsened or improved by eating food. °· You have abdominal pain that is worsened with eating fatty foods. °· You have a fever. °SEEK IMMEDIATE MEDICAL CARE IF: °· Your pain does not go away within 2 hours. °· You keep throwing up (vomiting). °· Your pain is felt only in portions of the abdomen, such as the right side or the left lower portion of the abdomen. °· You pass bloody or black tarry stools. °MAKE SURE YOU: °· Understand these instructions. °· Will watch your condition. °· Will get help right away if you are not doing well or get worse. °  °This information is not intended to replace advice given to you by your health care provider. Make sure you discuss  any questions you have with your health care provider. °  °Document Released: 10/30/2004 Document Revised: 10/11/2014 Document Reviewed: 09/29/2012 °Elsevier Interactive Patient Education ©2016 [employer] ° °Diarrhea °Diarrhea is frequent loose and watery bowel movements. It can cause you to feel weak and dehydrated. Dehydration can cause you to become tired and thirsty, have a dry mouth, and have decreased urination that often is dark yellow. Diarrhea is a sign of another problem, most often an infection that will not last long. In most cases, diarrhea typically lasts 2-3 days. However, it can last longer if it is a sign of something more serious. It is important to treat your diarrhea as directed by your caregiver to lessen or prevent future episodes of diarrhea. °CAUSES  °Some common causes include: °· Gastrointestinal infections caused by viruses, bacteria, or parasites. °· Food poisoning or food allergies. °· Certain medicines, such as antibiotics, chemotherapy, and laxatives. °· Artificial sweeteners and fructose. °· Digestive disorders. °HOME CARE INSTRUCTIONS °· Ensure adequate fluid intake (hydration): Have 1 cup (8 oz) of fluid for each diarrhea episode. Avoid fluids that contain simple sugars or sports drinks, fruit juices, whole milk products, and sodas. Your urine should be clear or pale yellow if you are drinking enough fluids. Hydrate with an oral rehydration solution that you can purchase at pharmacies, retail stores, and online. You can prepare an oral rehydration solution at home by mixing the following ingredients together: °·  - tsp table salt. °· ¾ tsp baking soda. °·    tsp salt substitute containing potassium chloride. °· 1  tablespoons sugar. °· 1 L (34 oz) of water. °· Certain foods and beverages may increase the speed at which food moves through the gastrointestinal (GI) tract. These foods and beverages should be avoided and include: °· Caffeinated and alcoholic beverages. °· High-fiber  foods, such as raw fruits and vegetables, nuts, seeds, and whole grain breads and cereals. °· Foods and beverages sweetened with sugar alcohols, such as xylitol, sorbitol, and mannitol. °· Some foods may be well tolerated and may help thicken stool including: °· Starchy foods, such as rice, toast, pasta, low-sugar cereal, oatmeal, grits, baked potatoes, crackers, and bagels. °· Bananas. °· Applesauce. °· Add probiotic-rich foods to help increase healthy bacteria in the GI tract, such as yogurt and fermented milk products. °· Wash your hands well after each diarrhea episode. °· Only take over-the-counter or prescription medicines as directed by your caregiver. °· Take a warm bath to relieve any burning or pain from frequent diarrhea episodes. °SEEK IMMEDIATE MEDICAL CARE IF:  °· You are unable to keep fluids down. °· You have persistent vomiting. °· You have blood in your stool, or your stools are black and tarry. °· You do not urinate in 6-8 hours, or there is only a small amount of very dark urine. °· You have abdominal pain that increases or localizes. °· You have weakness, dizziness, confusion, or light-headedness. °· You have a severe headache. °· Your diarrhea gets worse or does not get better. °· You have a fever or persistent symptoms for more than 2-3 days. °· You have a fever and your symptoms suddenly get worse. °MAKE SURE YOU:  °· Understand these instructions. °· Will watch your condition. °· Will get help right away if you are not doing well or get worse. °  °This information is not intended to replace advice given to you by your health care provider. Make sure you discuss any questions you have with your health care provider. °  °Document Released: 01/10/2002 Document Revised: 02/10/2014 Document Reviewed: 09/28/2011 °Elsevier Interactive Patient Education ©2016 [employer] ° °Nausea and Vomiting °Nausea is a sick feeling that often comes before throwing up (vomiting). Vomiting is a reflex where  stomach contents come out of your mouth. Vomiting can cause severe loss of body fluids (dehydration). Children and elderly adults can become dehydrated quickly, especially if they also have diarrhea. Nausea and vomiting are symptoms of a condition or disease. It is important to find the cause of your symptoms. °CAUSES  °· Direct irritation of the stomach lining. This irritation can result from increased acid production (gastroesophageal reflux disease), infection, food poisoning, taking certain medicines (such as nonsteroidal anti-inflammatory drugs), alcohol use, or tobacco use. °· Signals from the brain. These signals could be caused by a headache, heat exposure, an inner ear disturbance, increased pressure in the brain from injury, infection, a tumor, or a concussion, pain, emotional stimulus, or metabolic problems. °· An obstruction in the gastrointestinal tract (bowel obstruction). °· Illnesses such as diabetes, hepatitis, gallbladder problems, appendicitis, kidney problems, cancer, sepsis, atypical symptoms of a heart attack, or eating disorders. °· Medical treatments such as chemotherapy and radiation. °· Receiving medicine that makes you sleep (general anesthetic) during surgery. °DIAGNOSIS °Your caregiver may ask for tests to be done if the problems do not improve after a few days. Tests may also be done if symptoms are severe or if the reason for the nausea and vomiting is not clear. Tests may include: °· Urine tests. °·   Blood tests. °· Stool tests. °· Cultures (to look for evidence of infection). °· X-rays or other imaging studies. °Test results can help your caregiver make decisions about treatment or the need for additional tests. °TREATMENT °You need to stay well hydrated. Drink frequently but in small amounts. You may wish to drink water, sports drinks, clear broth, or eat frozen ice pops or gelatin dessert to help stay hydrated. When you eat, eating slowly may help prevent nausea. There are also some  antinausea medicines that may help prevent nausea. °HOME CARE INSTRUCTIONS  °· Take all medicine as directed by your caregiver. °· If you do not have an appetite, do not force yourself to eat. However, you must continue to drink fluids. °· If you have an appetite, eat a normal diet unless your caregiver tells you differently. °¨ Eat a variety of complex carbohydrates (rice, wheat, potatoes, bread), lean meats, yogurt, fruits, and vegetables. °¨ Avoid high-fat foods because they are more difficult to digest. °· Drink enough water and fluids to keep your urine clear or pale yellow. °· If you are dehydrated, ask your caregiver for specific rehydration instructions. Signs of dehydration may include: °¨ Severe thirst. °¨ Dry lips and mouth. °¨ Dizziness. °¨ Dark urine. °¨ Decreasing urine frequency and amount. °¨ Confusion. °¨ Rapid breathing or pulse. °SEEK IMMEDIATE MEDICAL CARE IF:  °· You have blood or brown flecks (like coffee grounds) in your vomit. °· You have black or bloody stools. °· You have a severe headache or stiff neck. °· You are confused. °· You have severe abdominal pain. °· You have chest pain or trouble breathing. °· You do not urinate at least once every 8 hours. °· You develop cold or clammy skin. °· You continue to vomit for longer than 24 to 48 hours. °· You have a fever. °MAKE SURE YOU:  °· Understand these instructions. °· Will watch your condition. °· Will get help right away if you are not doing well or get worse. °  °This information is not intended to replace advice given to you by your health care provider. Make sure you discuss any questions you have with your health care provider. °  °Document Released: 01/20/2005 Document Revised: 04/14/2011 Document Reviewed: 06/19/2010 °Elsevier Interactive Patient Education ©2016 [employer]. ° °

## 2015-01-10 NOTE — ED Notes (Signed)
Registration at bedside.

## 2015-01-10 NOTE — ED Notes (Signed)
Pt states she had a sudden onset of mid abdomen pain that started around 430pm. Pt states she vomitied once. Pt reports having an "infection in colon" 1 month ago and was on antibiotics for that.

## 2015-02-08 ENCOUNTER — Other Ambulatory Visit: Payer: Self-pay | Admitting: Gastroenterology

## 2015-03-11 ENCOUNTER — Emergency Department (HOSPITAL_COMMUNITY)
Admission: EM | Admit: 2015-03-11 | Discharge: 2015-03-11 | Disposition: A | Discharge status: Home / Self Care | Payer: Medicaid Other | Attending: Emergency Medicine | Admitting: Emergency Medicine

## 2015-03-11 ENCOUNTER — Encounter (HOSPITAL_COMMUNITY): Payer: Self-pay | Admitting: *Deleted

## 2015-03-11 DIAGNOSIS — Z3202 Encounter for pregnancy test, result negative: Secondary | ICD-10-CM | POA: Insufficient documentation

## 2015-03-11 DIAGNOSIS — Z87891 Personal history of nicotine dependence: Secondary | ICD-10-CM | POA: Diagnosis not present

## 2015-03-11 DIAGNOSIS — G8929 Other chronic pain: Secondary | ICD-10-CM | POA: Insufficient documentation

## 2015-03-11 DIAGNOSIS — R1033 Periumbilical pain: Secondary | ICD-10-CM | POA: Diagnosis not present

## 2015-03-11 DIAGNOSIS — R109 Unspecified abdominal pain: Secondary | ICD-10-CM | POA: Diagnosis present

## 2015-03-11 DIAGNOSIS — R1110 Vomiting, unspecified: Secondary | ICD-10-CM | POA: Insufficient documentation

## 2015-03-11 DIAGNOSIS — D649 Anemia, unspecified: Secondary | ICD-10-CM | POA: Insufficient documentation

## 2015-03-11 DIAGNOSIS — Z8719 Personal history of other diseases of the digestive system: Secondary | ICD-10-CM | POA: Diagnosis not present

## 2015-03-11 DIAGNOSIS — Z8701 Personal history of pneumonia (recurrent): Secondary | ICD-10-CM | POA: Insufficient documentation

## 2015-03-11 DIAGNOSIS — F329 Major depressive disorder, single episode, unspecified: Secondary | ICD-10-CM | POA: Insufficient documentation

## 2015-03-11 DIAGNOSIS — R1084 Generalized abdominal pain: Secondary | ICD-10-CM | POA: Diagnosis not present

## 2015-03-11 DIAGNOSIS — Z79899 Other long term (current) drug therapy: Secondary | ICD-10-CM | POA: Insufficient documentation

## 2015-03-11 DIAGNOSIS — F419 Anxiety disorder, unspecified: Secondary | ICD-10-CM | POA: Diagnosis not present

## 2015-03-11 DIAGNOSIS — Z8619 Personal history of other infectious and parasitic diseases: Secondary | ICD-10-CM | POA: Insufficient documentation

## 2015-03-11 DIAGNOSIS — Z87440 Personal history of urinary (tract) infections: Secondary | ICD-10-CM | POA: Diagnosis not present

## 2015-03-11 DIAGNOSIS — F1210 Cannabis abuse, uncomplicated: Secondary | ICD-10-CM | POA: Diagnosis not present

## 2015-03-11 LAB — COMPREHENSIVE METABOLIC PANEL
ALBUMIN: 4.9 g/dL (ref 3.5–5.0)
ALT: 19 U/L (ref 14–54)
ANION GAP: 9 (ref 5–15)
AST: 32 U/L (ref 15–41)
Alkaline Phosphatase: 54 U/L (ref 38–126)
BUN: 8 mg/dL (ref 6–20)
CHLORIDE: 106 mmol/L (ref 101–111)
CO2: 24 mmol/L (ref 22–32)
Calcium: 9.2 mg/dL (ref 8.9–10.3)
Creatinine, Ser: 0.57 mg/dL (ref 0.44–1.00)
GFR calc Af Amer: >60 mL/min (ref >60)
GFR calc non Af Amer: >60 mL/min (ref >60)
GLUCOSE: 127 mg/dL — AB (ref 65–99)
POTASSIUM: 3.9 mmol/L (ref 3.5–5.1)
Sodium: 139 mmol/L (ref 135–145)
TOTAL PROTEIN: 8.4 g/dL — AB (ref 6.5–8.1)
Total Bilirubin: 0.6 mg/dL (ref 0.3–1.2)

## 2015-03-11 LAB — RAPID URINE DRUG SCREEN, HOSP PERFORMED
Amphetamines: NOT DETECTED
Barbiturates: NOT DETECTED
Benzodiazepines: NOT DETECTED
COCAINE: NOT DETECTED
OPIATES: NOT DETECTED
TETRAHYDROCANNABINOL: POSITIVE — AB

## 2015-03-11 LAB — URINE MICROSCOPIC-ADD ON

## 2015-03-11 LAB — CBC
HEMATOCRIT: 39.9 % (ref 36.0–46.0)
HEMOGLOBIN: 13.7 g/dL (ref 12.0–15.0)
MCH: 30.5 pg (ref 26.0–34.0)
MCHC: 34.3 g/dL (ref 30.0–36.0)
MCV: 88.9 fL (ref 78.0–100.0)
Platelets: 395 10*3/uL (ref 150–400)
RBC: 4.49 MIL/uL (ref 3.87–5.11)
RDW: 13 % (ref 11.5–15.5)
WBC: 11.9 10*3/uL — AB (ref 4.0–10.5)

## 2015-03-11 LAB — LIPASE, BLOOD: LIPASE: 21 U/L (ref 11–51)

## 2015-03-11 LAB — URINALYSIS, ROUTINE W REFLEX MICROSCOPIC
Bilirubin Urine: NEGATIVE
GLUCOSE, UA: NEGATIVE mg/dL
Hgb urine dipstick: NEGATIVE
KETONES UR: NEGATIVE mg/dL
NITRITE: NEGATIVE
PH: 7.5 (ref 5.0–8.0)
PROTEIN: 30 mg/dL — AB
SPECIFIC GRAVITY, URINE: 1.024 (ref 1.005–1.030)

## 2015-03-11 LAB — HCG, QUANTITATIVE, PREGNANCY: hCG, Beta Chain, Quant, S: <1 m[IU]/mL (ref <5)

## 2015-03-11 MED ORDER — ONDANSETRON HCL 4 MG/2ML IJ SOLN
4.0000 mg | Freq: Once | INTRAMUSCULAR | Status: AC
  Administered 2015-03-11: 4 mg via INTRAVENOUS
  Filled 2015-03-11: qty 2

## 2015-03-11 MED ORDER — SODIUM CHLORIDE 0.9 % IV BOLUS (SEPSIS)
1000.0000 mL | Freq: Once | INTRAVENOUS | Status: AC
  Administered 2015-03-11: 1000 mL via INTRAVENOUS

## 2015-03-11 MED ORDER — ONDANSETRON HCL 4 MG PO TABS: 4.0000 mg | ORAL_TABLET | Freq: Four times a day (QID) | ORAL | Status: DC

## 2015-03-11 MED ORDER — KETOROLAC TROMETHAMINE 30 MG/ML IJ SOLN
30.0000 mg | Freq: Once | INTRAMUSCULAR | Status: AC
  Administered 2015-03-11: 30 mg via INTRAVENOUS
  Filled 2015-03-11: qty 1

## 2015-03-11 NOTE — ED Notes (Addendum)
Pt reports low abd pain with n/v this am.  Family reports that pt has had this problem for a long time which started when she was in high school.  She reports it recurred in the summer last year and has had problems intermittently since then.  She also reports calling [facility] this am and was instructed to come to the ED.  She also had colonoscopy done in Dec. 17th which was unremarkable.  Pt is hunched over-guarding her abd.  Reports she is unable to sit up d/t pain.

## 2015-03-11 NOTE — ED Provider Notes (Signed)
CSN: [id number]     Arrival date & time 03/11/15  1128 History   First MD Initiated Contact with Patient 03/11/15 1229     Chief Complaint  Patient presents with  . Abdominal Pain  . Vomiting   HPI    25 year old female with a history of chronic abdominal pain presents today with abdominal pain and vomiting. Patient reports that she's had the symptoms on and off for the last 2 years. She reports seeing a gastroenterologist in October, with a colonoscopy December 17 which appeared normal. She was supposed to have a follow-up following the colonoscopy but due to her provider being on vacation she has not been able to follow-up with him. She reports she had onset this morning after waking up of abdominal cramping and episodes of vomiting. She reports the symptoms are typical of her abdominal discomfort, but are more severe today. She reports symptoms are more severe when throwing up and seemed to subside in between episodes. She has promethazine at home she attempted taking this twice but threw up immediately after attempting taking it. She denies any fever or chills, cough, shortness of breath, lower abdominal pain, changes in the color clarity or characteristics of her urine, no vaginal discharge or bleeding.   Past Medical History  Diagnosis Date  . Pneumonia     AS CHILD  . Stomach disease 2009    NO MEDS CURRENTLY  . Anxiety 2009    ON MEDS X 1 YEAR  . Infection 02/2011    UTI  . Yeast infection   . H/O bacterial infection   . History of chlamydia   . Anemia   . Depression 2009    in HS, ok now   Past Surgical History  Procedure Laterality Date  . Wisdom tooth extraction  2007  . Tonsillectomy  2009    T&A   Family History  Problem Relation Age of Onset  . Lupus Maternal Aunt   . Hypertension Paternal Aunt   . Fibromyalgia Paternal Aunt   . Mental illness Paternal Aunt   . Mental illness Paternal Uncle   . Hypertension Paternal Uncle   . Arthritis Maternal Grandmother   .  Hyperlipidemia Maternal Grandmother   . Hypertension Maternal Grandmother   . Hyperlipidemia Paternal Grandmother   . Hypertension Paternal Grandmother   . Diabetes Paternal Grandfather   . Hyperlipidemia Paternal Grandfather   . Hypertension Paternal Grandfather   . Asthma Other   . Birth defects Maternal Uncle   . Other Neg Hx    Social History  Substance Use Topics  . Smoking status: Former Smoker -- 1.00 packs/day    Quit date: 06/28/2011  . Smokeless tobacco: Never Used  . Alcohol Use: 0.5 oz/week    1 drink(s) per week     Comment: social alcohol before pregnancy   OB History    Gravida Para Term Preterm AB TAB SAB Ectopic Multiple Living   1 1 1  0 0 0 0 0 0 1     Review of Systems  All other systems reviewed and are negative.   Allergies  Review of patient's allergies indicates no known allergies.  Home Medications   Prior to Admission medications   Medication Sig Start Date End Date Taking? Authorizing Provider  acetaminophen (TYLENOL) 500 MG tablet Take 1,000 mg by mouth every 6 (six) hours as needed. Takes for pain   Yes Historical Provider, MD  calcium carbonate (TUMS EX) 750 MG chewable tablet Chew 1 tablet by  mouth daily as needed. heartburn   Yes Historical Provider, MD  Melatonin 3 MG CAPS Take 3 mg by mouth at bedtime.   Yes Historical Provider, MD  promethazine (PHENERGAN) 25 MG tablet Take 25 mg by mouth every 6 (six) hours as needed for nausea or vomiting.   Yes Historical Provider, MD  clindamycin (CLEOCIN) 300 MG capsule Take 1 capsule (300 mg total) by mouth every 6 (six) hours. Patient not taking: Reported on 03/11/2015 03/27/13   Rodolph Bong, MD  ferrous sulfate 325 (65 FE) MG tablet Take 1 tablet (325 mg total) by mouth at bedtime. Patient not taking: Reported on 03/11/2015 02/14/12   Elsie Ra, CNM  fluconazole (DIFLUCAN) 150 MG tablet Take 1 tablet (150 mg total) by mouth once. Patient not taking: Reported on 03/11/2015 03/27/13   Rodolph Bong, MD   HYDROcodone-acetaminophen (NORCO/VICODIN) 5-325 MG tablet Take 1 tablet by mouth every 6 (six) hours as needed for moderate pain. Patient not taking: Reported on 03/11/2015 01/10/15   Rolm Gala Barrett, PA-C  ibuprofen (ADVIL,MOTRIN) 600 MG tablet Take 1 tablet (600 mg total) by mouth every 6 (six) hours. Patient not taking: Reported on 03/11/2015 02/14/12   Elsie Ra, CNM  ondansetron (ZOFRAN) 4 MG tablet Take 1 tablet (4 mg total) by mouth every 6 (six) hours. 03/11/15   Eyvonne Mechanic, PA-C  traMADol (ULTRAM) 50 MG tablet Take 1 tablet (50 mg total) by mouth every 6 (six) hours as needed. Patient not taking: Reported on 03/11/2015 03/27/13   Rodolph Bong, MD   BP 117/76 mmHg  Pulse 76  Temp(Src) 98.5 F (36.9 C) (Oral)  Resp 16  SpO2 99%  LMP 02/25/2015   Physical Exam  Constitutional: She is oriented to person, place, and time. She appears well-developed and well-nourished.  HENT:  Head: Normocephalic and atraumatic.  Eyes: Conjunctivae are normal. Pupils are equal, round, and reactive to light. Right eye exhibits no discharge. Left eye exhibits no discharge. No scleral icterus.  Neck: Normal range of motion. No JVD present. No tracheal deviation present.  Pulmonary/Chest: Effort normal and breath sounds normal. No stridor. No respiratory distress. She has no wheezes. She has no rales. She exhibits no tenderness.  Abdominal: She exhibits no distension and no mass. There is tenderness. There is no rebound and no guarding.  Minimally tender to palpation of the periumbilical region  Musculoskeletal: Normal range of motion. She exhibits no edema or tenderness.  Neurological: She is alert and oriented to person, place, and time. Coordination normal.  Skin: Skin is warm and dry. No rash noted. No erythema. No pallor.  Psychiatric: She has a normal mood and affect. Her behavior is normal. Judgment and thought content normal.  Nursing note and vitals reviewed.   ED Course  Procedures (including  critical care time) Labs Review Labs Reviewed  COMPREHENSIVE METABOLIC PANEL - Abnormal; Notable for the following:    Glucose, Bld 127 (*)    Total Protein 8.4 (*)    All other components within normal limits  CBC - Abnormal; Notable for the following:    WBC 11.9 (*)    All other components within normal limits  URINALYSIS, ROUTINE W REFLEX MICROSCOPIC (NOT AT [facility]) - Abnormal; Notable for the following:    APPearance HAZY (*)    Protein, ur 30 (*)    Leukocytes, UA SMALL (*)    All other components within normal limits  URINE RAPID DRUG SCREEN, HOSP PERFORMED - Abnormal; Notable for the following:  Tetrahydrocannabinol POSITIVE (*)    All other components within normal limits  URINE MICROSCOPIC-ADD ON - Abnormal; Notable for the following:    Squamous Epithelial / LPF 6-30 (*)    Bacteria, UA MANY (*)    All other components within normal limits  LIPASE, BLOOD  HCG, QUANTITATIVE, PREGNANCY    Imaging Review No results found. I have personally reviewed and evaluated these images and lab results as part of my medical decision-making.   EKG Interpretation None      MDM   Final diagnoses:  Generalized abdominal pain    Labs: Urinalysis, urinary rapid drug screen, CMP, CBC  Imaging:  Consults:  Therapeutics:  Discharge Meds: Zofran  Assessment/Plan: 25 year old female with chronic abdominal pain presents with acute episode. She has no vomiting here in the ED, no significant findings on physical, diagnostic, or historical exam that would necessitate further evaluation or management here in the ED. Patient is nontoxic well-appearing in no acute distress. She is instructed to follow-up with gastroenterology for further evaluation and management.         Eyvonne Mechanic, PA-C 03/12/15 1608  Cathren Laine, MD 03/13/15 [phone]

## 2015-03-11 NOTE — Discharge Instructions (Signed)
Abdominal Pain, Adult °Many things can cause abdominal pain. Usually, abdominal pain is not caused by a disease and will improve without treatment. It can often be observed and treated at home. Your health care provider will do a physical exam and possibly order blood tests and X-rays to help determine the seriousness of your pain. However, in many cases, more time must pass before a clear cause of the pain can be found. Before that point, your health care provider may not know if you need more testing or further treatment. °HOME CARE INSTRUCTIONS °Monitor your abdominal pain for any changes. The following actions may help to alleviate any discomfort you are experiencing: °· Only take over-the-counter or prescription medicines as directed by your health care provider. °· Do not take laxatives unless directed to do so by your health care provider. °· Try a clear liquid diet (broth, tea, or water) as directed by your health care provider. Slowly move to a bland diet as tolerated. °SEEK MEDICAL CARE IF: °· You have unexplained abdominal pain. °· You have abdominal pain associated with nausea or diarrhea. °· You have pain when you urinate or have a bowel movement. °· You experience abdominal pain that wakes you in the night. °· You have abdominal pain that is worsened or improved by eating food. °· You have abdominal pain that is worsened with eating fatty foods. °· You have a fever. °SEEK IMMEDIATE MEDICAL CARE IF: °· Your pain does not go away within 2 hours. °· You keep throwing up (vomiting). °· Your pain is felt only in portions of the abdomen, such as the right side or the left lower portion of the abdomen. °· You pass bloody or black tarry stools. °MAKE SURE YOU: °· Understand these instructions. °· Will watch your condition. °· Will get help right away if you are not doing well or get worse. °  °This information is not intended to replace advice given to you by your health care provider. Make sure you discuss  any questions you have with your health care provider. °  °Document Released: 10/30/2004 Document Revised: 10/11/2014 Document Reviewed: 09/29/2012 °Elsevier Interactive Patient Education ©2016 [employer] ° °Please read attached information. If you experience any new or worsening signs or symptoms please return to the emergency room for evaluation. Please follow-up with your primary care provider or specialist as discussed. Please use medication prescribed only as directed and discontinue taking if you have any concerning signs or symptoms.  ° °

## 2015-03-15 ENCOUNTER — Other Ambulatory Visit (HOSPITAL_COMMUNITY)
Admission: RE | Admit: 2015-03-15 | Discharge: 2015-03-15 | Disposition: A | Discharge status: Home / Self Care | Payer: Medicaid Other | Source: Ambulatory Visit | Attending: Obstetrics and Gynecology | Admitting: Obstetrics and Gynecology

## 2015-03-15 ENCOUNTER — Other Ambulatory Visit: Payer: Self-pay | Admitting: Gastroenterology

## 2015-03-15 ENCOUNTER — Other Ambulatory Visit: Payer: Self-pay | Admitting: Nurse Practitioner

## 2015-03-15 DIAGNOSIS — Z01419 Encounter for gynecological examination (general) (routine) without abnormal findings: Secondary | ICD-10-CM | POA: Diagnosis present

## 2015-03-15 DIAGNOSIS — R112 Nausea with vomiting, unspecified: Secondary | ICD-10-CM

## 2015-03-16 LAB — CYTOLOGY - PAP

## 2015-03-23 ENCOUNTER — Ambulatory Visit
Admission: RE | Admit: 2015-03-23 | Discharge: 2015-03-23 | Disposition: A | Discharge status: Home / Self Care | Payer: Medicaid Other | Source: Ambulatory Visit | Attending: Gastroenterology | Admitting: Gastroenterology

## 2015-03-23 DIAGNOSIS — R112 Nausea with vomiting, unspecified: Secondary | ICD-10-CM

## 2015-04-04 ENCOUNTER — Emergency Department (HOSPITAL_COMMUNITY)
Admission: EM | Admit: 2015-04-04 | Discharge: 2015-04-04 | Disposition: A | Discharge status: Home / Self Care | Payer: Medicaid Other | Attending: Emergency Medicine | Admitting: Emergency Medicine

## 2015-04-04 ENCOUNTER — Encounter (HOSPITAL_COMMUNITY): Payer: Self-pay | Admitting: *Deleted

## 2015-04-04 DIAGNOSIS — R112 Nausea with vomiting, unspecified: Secondary | ICD-10-CM | POA: Insufficient documentation

## 2015-04-04 DIAGNOSIS — R1084 Generalized abdominal pain: Secondary | ICD-10-CM | POA: Diagnosis not present

## 2015-04-04 LAB — COMPREHENSIVE METABOLIC PANEL
ALBUMIN: 4.5 g/dL (ref 3.5–5.0)
ALT: 19 U/L (ref 14–54)
ANION GAP: 9 (ref 5–15)
AST: 26 U/L (ref 15–41)
Alkaline Phosphatase: 48 U/L (ref 38–126)
BILIRUBIN TOTAL: 1 mg/dL (ref 0.3–1.2)
BUN: <5 mg/dL — ABNORMAL LOW (ref 6–20)
CHLORIDE: 111 mmol/L (ref 101–111)
CO2: 21 mmol/L — ABNORMAL LOW (ref 22–32)
Calcium: 9.5 mg/dL (ref 8.9–10.3)
Creatinine, Ser: 0.58 mg/dL (ref 0.44–1.00)
GFR calc Af Amer: >60 mL/min (ref >60)
GFR calc non Af Amer: >60 mL/min (ref >60)
GLUCOSE: 105 mg/dL — AB (ref 65–99)
POTASSIUM: 3.8 mmol/L (ref 3.5–5.1)
Sodium: 141 mmol/L (ref 135–145)
TOTAL PROTEIN: 8.1 g/dL (ref 6.5–8.1)

## 2015-04-04 LAB — CBC
HEMATOCRIT: 39.7 % (ref 36.0–46.0)
HEMOGLOBIN: 13.7 g/dL (ref 12.0–15.0)
MCH: 30.3 pg (ref 26.0–34.0)
MCHC: 34.5 g/dL (ref 30.0–36.0)
MCV: 87.8 fL (ref 78.0–100.0)
Platelets: 388 10*3/uL (ref 150–400)
RBC: 4.52 MIL/uL (ref 3.87–5.11)
RDW: 13.1 % (ref 11.5–15.5)
WBC: 11.7 10*3/uL — AB (ref 4.0–10.5)

## 2015-04-04 LAB — I-STAT BETA HCG BLOOD, ED (MC, WL, AP ONLY): I-stat hCG, quantitative: <5 m[IU]/mL (ref <5)

## 2015-04-04 LAB — LIPASE, BLOOD: LIPASE: 19 U/L (ref 11–51)

## 2015-04-04 NOTE — ED Notes (Signed)
Pt stst that she is leaving to get her child.

## 2015-04-04 NOTE — ED Notes (Signed)
Pt reports generalized abd pain and n/v. Hx of same.

## 2015-04-05 ENCOUNTER — Encounter (HOSPITAL_COMMUNITY): Payer: Self-pay | Admitting: Emergency Medicine

## 2015-04-05 ENCOUNTER — Emergency Department (HOSPITAL_COMMUNITY)
Admission: EM | Admit: 2015-04-05 | Discharge: 2015-04-05 | Disposition: A | Discharge status: Home / Self Care | Payer: Medicaid Other | Attending: Emergency Medicine | Admitting: Emergency Medicine

## 2015-04-05 ENCOUNTER — Emergency Department (HOSPITAL_COMMUNITY): Payer: Medicaid Other

## 2015-04-05 DIAGNOSIS — R112 Nausea with vomiting, unspecified: Secondary | ICD-10-CM | POA: Diagnosis not present

## 2015-04-05 DIAGNOSIS — Z862 Personal history of diseases of the blood and blood-forming organs and certain disorders involving the immune mechanism: Secondary | ICD-10-CM | POA: Diagnosis not present

## 2015-04-05 DIAGNOSIS — Z8619 Personal history of other infectious and parasitic diseases: Secondary | ICD-10-CM | POA: Diagnosis not present

## 2015-04-05 DIAGNOSIS — Z87891 Personal history of nicotine dependence: Secondary | ICD-10-CM | POA: Insufficient documentation

## 2015-04-05 DIAGNOSIS — R1013 Epigastric pain: Secondary | ICD-10-CM | POA: Insufficient documentation

## 2015-04-05 DIAGNOSIS — Z79899 Other long term (current) drug therapy: Secondary | ICD-10-CM | POA: Insufficient documentation

## 2015-04-05 DIAGNOSIS — Z87440 Personal history of urinary (tract) infections: Secondary | ICD-10-CM | POA: Insufficient documentation

## 2015-04-05 DIAGNOSIS — R1011 Right upper quadrant pain: Secondary | ICD-10-CM | POA: Diagnosis present

## 2015-04-05 DIAGNOSIS — Z8701 Personal history of pneumonia (recurrent): Secondary | ICD-10-CM | POA: Diagnosis not present

## 2015-04-05 DIAGNOSIS — Z8659 Personal history of other mental and behavioral disorders: Secondary | ICD-10-CM | POA: Insufficient documentation

## 2015-04-05 DIAGNOSIS — G8929 Other chronic pain: Secondary | ICD-10-CM | POA: Insufficient documentation

## 2015-04-05 DIAGNOSIS — Z8719 Personal history of other diseases of the digestive system: Secondary | ICD-10-CM | POA: Diagnosis not present

## 2015-04-05 DIAGNOSIS — R197 Diarrhea, unspecified: Secondary | ICD-10-CM | POA: Insufficient documentation

## 2015-04-05 LAB — I-STAT CHEM 8, ED
BUN: 7 mg/dL (ref 6–20)
CREATININE: 0.6 mg/dL (ref 0.44–1.00)
Calcium, Ion: 1.08 mmol/L — ABNORMAL LOW (ref 1.12–1.23)
Chloride: 104 mmol/L (ref 101–111)
GLUCOSE: 89 mg/dL (ref 65–99)
HCT: 48 % — ABNORMAL HIGH (ref 36.0–46.0)
HEMOGLOBIN: 16.3 g/dL — AB (ref 12.0–15.0)
Potassium: 3.5 mmol/L (ref 3.5–5.1)
Sodium: 143 mmol/L (ref 135–145)
TCO2: 25 mmol/L (ref 0–100)

## 2015-04-05 MED ORDER — ONDANSETRON 4 MG PO TBDP: 4.0000 mg | ORAL_TABLET | Freq: Three times a day (TID) | ORAL | Status: DC | PRN

## 2015-04-05 MED ORDER — ONDANSETRON HCL 4 MG/2ML IJ SOLN: 4.0000 mg | Freq: Once | INTRAMUSCULAR | Status: DC

## 2015-04-05 MED ORDER — METOCLOPRAMIDE HCL 5 MG/ML IJ SOLN
10.0000 mg | INTRAMUSCULAR | Status: AC
  Administered 2015-04-05: 10 mg via INTRAVENOUS
  Filled 2015-04-05: qty 2

## 2015-04-05 MED ORDER — HYDROMORPHONE HCL 1 MG/ML IJ SOLN
1.0000 mg | Freq: Once | INTRAMUSCULAR | Status: AC
  Administered 2015-04-05: 1 mg via INTRAVENOUS
  Filled 2015-04-05: qty 1

## 2015-04-05 MED ORDER — SODIUM CHLORIDE 0.9 % IV BOLUS (SEPSIS)
1000.0000 mL | Freq: Once | INTRAVENOUS | Status: AC
  Administered 2015-04-05: 1000 mL via INTRAVENOUS

## 2015-04-05 MED ORDER — HYDROCODONE-ACETAMINOPHEN 5-325 MG PO TABS: 1.0000 | ORAL_TABLET | ORAL | Status: DC | PRN

## 2015-04-05 NOTE — ED Provider Notes (Signed)
Right upper quadrant pain intermittently for one year. She gets more pain approximately every 2 weeks. She vomited one time today. She treated self with Zofran without relief. She is not nauseated presently. Pain is somewhat exacerbated by eating. Patient had abdominal ultrasound to 17th 2017 which showed no acute abnormality.  Doug Sou, MD 04/05/15 [phone]

## 2015-04-05 NOTE — ED Notes (Signed)
MD at bedside. 

## 2015-04-05 NOTE — Discharge Instructions (Signed)
Take your medications as prescribed. Continue drinking fluids at home to remain hydrated. Follow-up with Dr. Randa Evens at your scheduled appointment on Monday. Return to the emergency department if symptoms worsen or new onset of fever, vomiting blood, unable to keep fluids down, diarrhea, blood in stool.

## 2015-04-05 NOTE — ED Notes (Signed)
Pt c/o of N/V/D and stomach pain for the last year however many visits different DX. Pt saw her GI last week for these symptoms and was informed to increase her Prilosec. Seen at [facility] yesterday however did not stay.  Seen by GI MD Randa Evens for these complaints. Pt states with these flare ups she is seen in ED.

## 2015-04-05 NOTE — ED Notes (Signed)
ED PA at bedside

## 2015-04-05 NOTE — ED Notes (Signed)
Patient presents for left mid abdominal pain, nausea and emesis (5 episodes yesterday). Seen yesterday at [facility] and [facility] after blood draw. Denies diarrhea, fever/chills.

## 2015-04-05 NOTE — ED Notes (Signed)
Patient transported to X-ray 

## 2015-04-05 NOTE — ED Provider Notes (Signed)
CSN: [id number]     Arrival date & time 04/05/15  1410 History   First MD Initiated Contact with Patient 04/05/15 1644     Chief Complaint  Patient presents with  . Abdominal Pain     (Consider location/radiation/quality/duration/timing/severity/associated sxs/prior Treatment) HPI   Patient is a 25 year old female with past medical history of chronic abdominal pain who presents to the ED with report of right upper quadrant pain, onset yesterday morning. Patient reports when she woke up yesterday morning she began having sharp stabbing constant pain to her right upper quadrant. She notes the pain is worse with standing, movement or after eating. Denies any alleviating factors. Endorses associated nausea, NBNB vomiting and nonbloody diarrhea. Patient reports that she took Zofran at home but vomited after taking the medication. Denies taking any other medications prior to arrival. Patient notes she has had similar episodes of abdominal pain in the past and notes that this episode is similar to her past episodes. Patient states she is followed by Dr. Randa Evens at [facility] and notes she recently saw him last week when she had an ultrasound done to evaluate her gallbladder which she notes was unremarkable. LMP in January, however patient reports that she received an Nexplanon on 03/15/15. Denies any history of abdominal surgeries.  GI = Dr. Carman Ching [facility])  Past Medical History  Diagnosis Date  . Pneumonia     AS CHILD  . Stomach disease 2009    NO MEDS CURRENTLY  . Anxiety 2009    ON MEDS X 1 YEAR  . Infection 02/2011    UTI  . Yeast infection   . H/O bacterial infection   . History of chlamydia   . Anemia   . Depression 2009    in HS, ok now   Past Surgical History  Procedure Laterality Date  . Wisdom tooth extraction  2007  . Tonsillectomy  2009    T&A   Family History  Problem Relation Age of Onset  . Lupus Maternal Aunt   . Hypertension Paternal Aunt   . Fibromyalgia  Paternal Aunt   . Mental illness Paternal Aunt   . Mental illness Paternal Uncle   . Hypertension Paternal Uncle   . Arthritis Maternal Grandmother   . Hyperlipidemia Maternal Grandmother   . Hypertension Maternal Grandmother   . Hyperlipidemia Paternal Grandmother   . Hypertension Paternal Grandmother   . Diabetes Paternal Grandfather   . Hyperlipidemia Paternal Grandfather   . Hypertension Paternal Grandfather   . Asthma Other   . Birth defects Maternal Uncle   . Other Neg Hx    Social History  Substance Use Topics  . Smoking status: Former Smoker -- 1.00 packs/day    Quit date: 06/28/2011  . Smokeless tobacco: Never Used  . Alcohol Use: 0.5 oz/week    1 Standard drinks or equivalent per week     Comment: social alcohol before pregnancy   OB History    Gravida Para Term Preterm AB TAB SAB Ectopic Multiple Living   1 1 1  0 0 0 0 0 0 1     Review of Systems  Gastrointestinal: Positive for nausea, vomiting, abdominal pain and diarrhea.  All other systems reviewed and are negative.     Allergies  Review of patient's allergies indicates no known allergies.  Home Medications   Prior to Admission medications   Medication Sig Start Date End Date Taking? Authorizing Provider  Melatonin 3 MG CAPS Take 3 mg by mouth at  bedtime.   Yes Historical Provider, MD  omeprazole (PRILOSEC) 20 MG capsule Take 20 mg by mouth 2 (two) times daily. 03/14/15  Yes Historical Provider, MD  ondansetron (ZOFRAN) 4 MG tablet Take 1 tablet (4 mg total) by mouth every 6 (six) hours. Patient taking differently: Take 4 mg by mouth every 6 (six) hours as needed for nausea or vomiting.  03/11/15  Yes Eyvonne Mechanic, PA-C  HYDROcodone-acetaminophen (NORCO/VICODIN) 5-325 MG tablet Take 1 tablet by mouth every 4 (four) hours as needed. 04/05/15   Barrett Henle, PA-C  ondansetron (ZOFRAN ODT) 4 MG disintegrating tablet Take 1 tablet (4 mg total) by mouth every 8 (eight) hours as needed for nausea or  vomiting. 04/05/15   Satira Sark Shanica Castellanos, PA-C   BP 105/68 mmHg  Pulse 76  Temp(Src) 98.3 F (36.8 C) (Oral)  Resp 13  Ht  (1.575 m)  Wt 72.576 kg  BMI 29.26 kg/m2  SpO2 100%  LMP 02/25/2015 Physical Exam  Constitutional: She is oriented to person, place, and time. She appears well-developed and well-nourished.  HENT:  Head: Normocephalic and atraumatic.  Mouth/Throat: Oropharynx is clear and moist. No oropharyngeal exudate.  Eyes: Conjunctivae and EOM are normal. Right eye exhibits no discharge. Left eye exhibits no discharge. No scleral icterus.  Neck: Normal range of motion. Neck supple.  Cardiovascular: Normal rate, regular rhythm, normal heart sounds and intact distal pulses.   Pulmonary/Chest: Effort normal and breath sounds normal. No respiratory distress. She has no wheezes. She has no rales. She exhibits no tenderness.  Abdominal: Soft. Bowel sounds are normal. She exhibits no distension and no mass. There is tenderness in the right upper quadrant and epigastric area. There is no rigidity, no rebound and no guarding.  Musculoskeletal: She exhibits no edema.  Lymphadenopathy:    She has no cervical adenopathy.  Neurological: She is alert and oriented to person, place, and time.  Skin: Skin is warm and dry.  Nursing note and vitals reviewed.   ED Course  Procedures (including critical care time) Labs Review Labs Reviewed  I-STAT CHEM 8, ED - Abnormal; Notable for the following:    Calcium, Ion 1.08 (*)    Hemoglobin 16.3 (*)    HCT 48.0 (*)    All other components within normal limits    Imaging Review Dg Abd 1 View  04/05/2015  CLINICAL DATA:  Nausea and vomiting EXAM: ABDOMEN - 1 VIEW COMPARISON:  None. FINDINGS: Scattered large and small bowel gas is noted. No abnormal mass or abnormal calcifications are seen. No acute bony abnormality is noted. IMPRESSION: No acute abnormality seen. Electronically Signed   By: Alcide Clever M.D.   On: 04/05/2015 19:20   I  have personally reviewed and evaluated these images and lab results as part of my medical decision-making.   EKG Interpretation None      MDM   Final diagnoses:  Right upper quadrant pain    Patient presents with right upper quadrant pain with associated nausea, vomiting and diarrhea. Patient reports having similar episodes of pain over the past year. She has been seen by Dr. Randa Evens, GI. [facility]. Exam revealed right upper quadrant tenderness and epigastric tenderness, no peritoneal signs. Pt given IVF, zofran and pain meds.  Patient initially came to the ED yesterday and had blood work done however she left prior to being seen by a provider. Her blood work from yesterday showed no acute abnormalities. Chem-8 done today was unremarkable. Abdominal xray negative. Chart review shows  that patient recently had an abdominal ultrasound done on 2/17 which was unremarkable. Consulted GI. Dr. Matthias Hughs having the pt admitted for refractory pain of unclear cause. He also reported that the pt will likely need a HIDA scan for further evaluation. On reevaluation pt reports she is feeling better. Discussed results and plan for admission. Pt reports understanding of our advice for admission but refuses admission at this time and reports that she needs to go home to take care of her daughter. Patient has a follow-up appointment scheduled with Dr. Randa Evens on Monday (04/09/15). Patient is able to tolerate by mouth in the ED. Plan to discharge patient home with symptomatic treatment and strongly advised patient to follow up with Dr. Randa Evens at her follow-up appointment in 3 days. Patient reports she will follow-up with Dr. Randa Evens at her scheduled appointment.  Evaluation does not show pathology requring ongoing emergent intervention or admission. Pt is hemodynamically stable and mentating appropriately. Discussed findings/results and plan with patient/guardian, who agrees with plan. All questions answered. Return precautions  discussed and outpatient follow up given.    Satira Sark [place], [place] 04/05/15 2013  Doug Sou, MD 04/06/15 [phone]

## 2015-04-09 ENCOUNTER — Other Ambulatory Visit (HOSPITAL_COMMUNITY): Payer: Self-pay | Admitting: Gastroenterology

## 2015-04-09 DIAGNOSIS — R1013 Epigastric pain: Secondary | ICD-10-CM

## 2015-04-13 ENCOUNTER — Other Ambulatory Visit: Payer: Self-pay | Admitting: Gastroenterology

## 2015-04-20 ENCOUNTER — Ambulatory Visit (HOSPITAL_COMMUNITY)
Admission: RE | Admit: 2015-04-20 | Discharge: 2015-04-20 | Disposition: A | Discharge status: Home / Self Care | Payer: Medicaid Other | Source: Ambulatory Visit | Attending: Gastroenterology | Admitting: Gastroenterology

## 2015-04-20 DIAGNOSIS — R1013 Epigastric pain: Secondary | ICD-10-CM | POA: Insufficient documentation

## 2015-04-20 MED ORDER — TECHNETIUM TC 99M MEBROFENIN IV KIT
5.0000 | PACK | Freq: Once | INTRAVENOUS | Status: AC | PRN
  Administered 2015-04-20: 5 via INTRAVENOUS

## 2015-06-15 ENCOUNTER — Emergency Department (HOSPITAL_COMMUNITY)
Admission: EM | Admit: 2015-06-15 | Discharge: 2015-06-15 | Disposition: A | Discharge status: Home / Self Care | Payer: Medicaid Other | Attending: Emergency Medicine | Admitting: Emergency Medicine

## 2015-06-15 ENCOUNTER — Encounter (HOSPITAL_COMMUNITY): Payer: Self-pay

## 2015-06-15 DIAGNOSIS — Z8701 Personal history of pneumonia (recurrent): Secondary | ICD-10-CM | POA: Diagnosis not present

## 2015-06-15 DIAGNOSIS — R112 Nausea with vomiting, unspecified: Secondary | ICD-10-CM | POA: Diagnosis present

## 2015-06-15 DIAGNOSIS — Z79899 Other long term (current) drug therapy: Secondary | ICD-10-CM | POA: Insufficient documentation

## 2015-06-15 DIAGNOSIS — R197 Diarrhea, unspecified: Secondary | ICD-10-CM | POA: Insufficient documentation

## 2015-06-15 DIAGNOSIS — Z862 Personal history of diseases of the blood and blood-forming organs and certain disorders involving the immune mechanism: Secondary | ICD-10-CM | POA: Insufficient documentation

## 2015-06-15 DIAGNOSIS — Z8659 Personal history of other mental and behavioral disorders: Secondary | ICD-10-CM | POA: Diagnosis not present

## 2015-06-15 DIAGNOSIS — R42 Dizziness and giddiness: Secondary | ICD-10-CM | POA: Insufficient documentation

## 2015-06-15 DIAGNOSIS — R109 Unspecified abdominal pain: Secondary | ICD-10-CM | POA: Diagnosis not present

## 2015-06-15 DIAGNOSIS — Z8619 Personal history of other infectious and parasitic diseases: Secondary | ICD-10-CM | POA: Insufficient documentation

## 2015-06-15 DIAGNOSIS — F17200 Nicotine dependence, unspecified, uncomplicated: Secondary | ICD-10-CM | POA: Insufficient documentation

## 2015-06-15 MED ORDER — SODIUM CHLORIDE 0.9 % IV BOLUS (SEPSIS)
1000.0000 mL | Freq: Once | INTRAVENOUS | Status: AC
Start: 2015-06-15 — End: 2015-06-15
  Administered 2015-06-15: 1000 mL via INTRAVENOUS

## 2015-06-15 MED ORDER — ONDANSETRON 4 MG PO TBDP
4.0000 mg | ORAL_TABLET | Freq: Once | ORAL | Status: AC
Start: 2015-06-15 — End: 2015-06-15
  Administered 2015-06-15: 4 mg via ORAL
  Filled 2015-06-15: qty 1

## 2015-06-15 MED ORDER — PROMETHAZINE HCL 25 MG RE SUPP: 25.0000 mg | Freq: Four times a day (QID) | RECTAL | Status: DC | PRN

## 2015-06-15 MED ORDER — PROCHLORPERAZINE EDISYLATE 5 MG/ML IJ SOLN
10.0000 mg | Freq: Once | INTRAMUSCULAR | Status: AC
  Administered 2015-06-15: 10 mg via INTRAVENOUS
  Filled 2015-06-15: qty 2

## 2015-06-15 NOTE — ED Notes (Signed)
Pt requesting to leave. MD Rubin PayorPickering made aware. MD reports no need for urine sample prior to d/c

## 2015-06-15 NOTE — Discharge Instructions (Signed)

## 2015-06-15 NOTE — ED Notes (Signed)
Pt states she is scheduled for gallbladder to be removed on May 30th but she came in due to sudden onset of abdominal pain with n/v/d early this morning; pt states pain at 7/10 on arrival; Pt states pain is in right upper quadrant; pt states if she sits up she feels like she will pass out. Pt a&ox 4 on arrival.

## 2015-06-15 NOTE — ED Notes (Signed)
MD at bedside. 

## 2015-06-15 NOTE — ED Provider Notes (Signed)
CSN: [id number]     Arrival date & time 06/15/15  [id number] History   First MD Initiated Contact with Patient 06/15/15 [id number]     Chief Complaint  Patient presents with  . Abdominal Pain  . Nausea  . Vomiting  . Diarrhea    Patient is a 25 y.o. female presenting with abdominal pain and diarrhea. The history is provided by the patient.  Abdominal Pain Associated symptoms: diarrhea, nausea and vomiting   Associated symptoms: no chest pain and no shortness of breath   Diarrhea Associated symptoms: abdominal pain and vomiting   Associated symptoms: no headaches   Patient presents with nausea vomiting diarrhea and abdominal pain. States she's had this chronically for the last 15 years. States it was initially thought to be irritable bowel and was free of it for a few years but has been back worse recently. She has episodes or showed that crampy severe abdominal pain worsening of her abdomen. Also accompanied with nausea vomiting usually diarrhea. She began around 3 in the morning, 4 hours prior to arrival. She has seen GI in the past and is scheduled to have her gallbladder out by Dr.Tsuie at the end of this month. No fevers. She now has a pregnancy implant and states she's had slight vaginal bleeding time. She does smoke marijuana somewhat frequently. States she took a cough and the vomiting did not get any better. She states she does sometimes take hot showers to make her feel better when she is throwing out. She states at this time she felt lightheaded when she stood up.   Past Medical History  Diagnosis Date  . Pneumonia     AS CHILD  . Stomach disease 2009    NO MEDS CURRENTLY  . Anxiety 2009    ON MEDS X 1 YEAR  . Infection 02/2011    UTI  . Yeast infection   . H/O bacterial infection   . History of chlamydia   . Anemia   . Depression 2009    in HS, ok now   Past Surgical History  Procedure Laterality Date  . Wisdom tooth extraction  2007  . Tonsillectomy  2009    T&A   Family  History  Problem Relation Age of Onset  . Lupus Maternal Aunt   . Hypertension Paternal Aunt   . Fibromyalgia Paternal Aunt   . Mental illness Paternal Aunt   . Mental illness Paternal Uncle   . Hypertension Paternal Uncle   . Arthritis Maternal Grandmother   . Hyperlipidemia Maternal Grandmother   . Hypertension Maternal Grandmother   . Hyperlipidemia Paternal Grandmother   . Hypertension Paternal Grandmother   . Diabetes Paternal Grandfather   . Hyperlipidemia Paternal Grandfather   . Hypertension Paternal Grandfather   . Asthma Other   . Birth defects Maternal Uncle   . Other Neg Hx    Social History  Substance Use Topics  . Smoking status: Current Every Day Smoker -- 1.00 packs/day    Last Attempt to Quit: 06/28/2011  . Smokeless tobacco: Never Used  . Alcohol Use: 0.5 oz/week    1 Standard drinks or equivalent per week     Comment: social alcohol before pregnancy   OB History    Gravida Para Term Preterm AB TAB SAB Ectopic Multiple Living   1 1 1  0 0 0 0 0 0 1     Review of Systems  Constitutional: Negative for activity change and appetite change.  Eyes: Negative for pain.  Respiratory: Negative for chest tightness and shortness of breath.   Cardiovascular: Negative for chest pain and leg swelling.  Gastrointestinal: Positive for nausea, vomiting, abdominal pain and diarrhea.  Genitourinary: Negative for flank pain.  Musculoskeletal: Negative for back pain and neck stiffness.  Skin: Negative for rash.  Neurological: Positive for light-headedness. Negative for weakness, numbness and headaches.  Psychiatric/Behavioral: Negative for behavioral problems.      Allergies  Review of patient's allergies indicates no known allergies.  Home Medications   Prior to Admission medications   Medication Sig Start Date End Date Taking? Authorizing Provider  etonogestrel (NEXPLANON) 68 MG IMPL implant 1 each by Subdermal route once.   Yes Historical Provider, MD  Melatonin  3 MG CAPS Take 3 mg by mouth at bedtime.   Yes Historical Provider, MD  omeprazole (PRILOSEC) 20 MG capsule Take 20 mg by mouth 2 (two) times daily. 03/14/15  Yes Historical Provider, MD  promethazine (PHENERGAN) 25 MG suppository Place 1 suppository (25 mg total) rectally every 6 (six) hours as needed for nausea or vomiting. 06/15/15   Benjiman Core, MD   BP 132/85 mmHg  Pulse 72  Temp(Src) 98.2 F (36.8 C) (Oral)  Resp 18  Ht  (1.575 m)  Wt 165 lb (74.844 kg)  BMI 30.17 kg/m2  SpO2 99% Physical Exam  Constitutional: She appears well-developed.  Patient is bent over in bed on her knees  HENT:  Head: Atraumatic.  Neck: Neck supple.  Cardiovascular: Normal rate.   Pulmonary/Chest: Effort normal.  Abdominal:  Minimal tenderness.  Neurological: She is alert.  Skin: Skin is warm.    ED Course  Procedures (including critical care time) Labs Review Labs Reviewed - No data to display  Imaging Review No results found. I have personally reviewed and evaluated these images and lab results as part of my medical decision-making.   EKG Interpretation None      MDM   Final diagnoses:  Nausea vomiting and diarrhea    Patient with acute on chronic abdominal pain and vomiting. Due to get gallbladder out soon. Patient has had reassuring labs in the past. Patient received some IV fluids and IV Compazine and then felt the urgent need. Discussed with her but she did not want to stay. Discharge home.    Benjiman Core, MD 06/15/15 1530

## 2015-07-03 ENCOUNTER — Other Ambulatory Visit: Payer: Self-pay | Admitting: General Surgery

## 2016-04-09 ENCOUNTER — Emergency Department (HOSPITAL_COMMUNITY)
Admission: EM | Admit: 2016-04-09 | Discharge: 2016-04-09 | Disposition: A | Discharge status: Home / Self Care | Payer: Medicaid Other | Attending: Emergency Medicine | Admitting: Emergency Medicine

## 2016-04-09 ENCOUNTER — Emergency Department (HOSPITAL_COMMUNITY): Payer: Medicaid Other

## 2016-04-09 ENCOUNTER — Encounter (HOSPITAL_COMMUNITY): Payer: Self-pay | Admitting: Emergency Medicine

## 2016-04-09 DIAGNOSIS — F17200 Nicotine dependence, unspecified, uncomplicated: Secondary | ICD-10-CM | POA: Insufficient documentation

## 2016-04-09 DIAGNOSIS — R1011 Right upper quadrant pain: Secondary | ICD-10-CM

## 2016-04-09 LAB — CBC WITH DIFFERENTIAL/PLATELET
BASOS ABS: 0 10*3/uL (ref 0.0–0.1)
BASOS PCT: 0 %
EOS ABS: 0.2 10*3/uL (ref 0.0–0.7)
Eosinophils Relative: 3 %
HCT: 40.5 % (ref 36.0–46.0)
Hemoglobin: 13.6 g/dL (ref 12.0–15.0)
Lymphocytes Relative: 42 %
Lymphs Abs: 3.2 10*3/uL (ref 0.7–4.0)
MCH: 29.4 pg (ref 26.0–34.0)
MCHC: 33.6 g/dL (ref 30.0–36.0)
MCV: 87.5 fL (ref 78.0–100.0)
Monocytes Absolute: 0.8 10*3/uL (ref 0.1–1.0)
Monocytes Relative: 10 %
Neutro Abs: 3.4 10*3/uL (ref 1.7–7.7)
Neutrophils Relative %: 45 %
PLATELETS: 363 10*3/uL (ref 150–400)
RBC: 4.63 MIL/uL (ref 3.87–5.11)
RDW: 13.2 % (ref 11.5–15.5)
WBC: 7.7 10*3/uL (ref 4.0–10.5)

## 2016-04-09 LAB — URINALYSIS, ROUTINE W REFLEX MICROSCOPIC
BILIRUBIN URINE: NEGATIVE
Glucose, UA: NEGATIVE mg/dL
Hgb urine dipstick: NEGATIVE
KETONES UR: NEGATIVE mg/dL
NITRITE: NEGATIVE
Protein, ur: NEGATIVE mg/dL
Specific Gravity, Urine: 1.003 — ABNORMAL LOW (ref 1.005–1.030)
pH: 7 (ref 5.0–8.0)

## 2016-04-09 LAB — COMPREHENSIVE METABOLIC PANEL
ALT: 23 U/L (ref 14–54)
AST: 22 U/L (ref 15–41)
Albumin: 4.6 g/dL (ref 3.5–5.0)
Alkaline Phosphatase: 47 U/L (ref 38–126)
Anion gap: 8 (ref 5–15)
BUN: 5 mg/dL — ABNORMAL LOW (ref 6–20)
CHLORIDE: 107 mmol/L (ref 101–111)
CO2: 25 mmol/L (ref 22–32)
CREATININE: 0.69 mg/dL (ref 0.44–1.00)
Calcium: 9.5 mg/dL (ref 8.9–10.3)
GFR calc non Af Amer: >60 mL/min (ref >60)
Glucose, Bld: 93 mg/dL (ref 65–99)
Potassium: 3.6 mmol/L (ref 3.5–5.1)
SODIUM: 140 mmol/L (ref 135–145)
Total Bilirubin: 1 mg/dL (ref 0.3–1.2)
Total Protein: 7.7 g/dL (ref 6.5–8.1)

## 2016-04-09 LAB — PREGNANCY, URINE: PREG TEST UR: NEGATIVE

## 2016-04-09 LAB — LIPASE, BLOOD: Lipase: 24 U/L (ref 11–51)

## 2016-04-09 MED ORDER — ONDANSETRON HCL 4 MG/2ML IJ SOLN
4.0000 mg | Freq: Once | INTRAMUSCULAR | Status: AC
  Administered 2016-04-09: 4 mg via INTRAVENOUS
  Filled 2016-04-09: qty 2

## 2016-04-09 MED ORDER — HYDROCODONE-ACETAMINOPHEN 5-325 MG PO TABS: 1.0000 | ORAL_TABLET | ORAL | 0 refills | Status: DC | PRN

## 2016-04-09 MED ORDER — DICYCLOMINE HCL 10 MG/ML IM SOLN
20.0000 mg | Freq: Once | INTRAMUSCULAR | Status: AC
  Administered 2016-04-09: 20 mg via INTRAMUSCULAR
  Filled 2016-04-09: qty 2

## 2016-04-09 MED ORDER — DICYCLOMINE HCL 20 MG PO TABS: 20.0000 mg | ORAL_TABLET | Freq: Two times a day (BID) | ORAL | 0 refills | Status: DC | PRN

## 2016-04-09 MED ORDER — HYDROMORPHONE HCL 2 MG/ML IJ SOLN
0.5000 mg | Freq: Once | INTRAMUSCULAR | Status: AC
  Administered 2016-04-09: 0.5 mg via INTRAVENOUS
  Filled 2016-04-09: qty 1

## 2016-04-09 MED ORDER — ONDANSETRON HCL 4 MG PO TABS: 4.0000 mg | ORAL_TABLET | Freq: Four times a day (QID) | ORAL | 0 refills | Status: DC

## 2016-04-09 NOTE — ED Notes (Signed)
Patient walked to the bathroom patient is back in the room resting

## 2016-04-09 NOTE — Discharge Instructions (Signed)
Your lab tests and xrays are negative today for the source of your pain.  You have been prescribed medication to help with your symptoms.  Followup with Dr. Randa EvensEdwards as planned.  You may take the hydrocodone prescribed for pain relief.  This will make you drowsy - do not drive within 4 hours of taking this medication.

## 2016-04-09 NOTE — ED Provider Notes (Signed)
[facility]-EMERGENCY DEPT Provider Note   CSN: [id number] Arrival date & time: 04/09/16  4098     History   Chief Complaint Chief Complaint  Patient presents with  . Abdominal Pain    HPI Diane Kent is a 26 y.o. female with pas medical history outlined below, most significant for lap chole 5/17 by Dr. Harlon Flor presenting with sharp, constant RUQ pain which has been present for 2 days, and is similar to pain experienced prior to her surgery and since.  She endorses frequent episodes of pain usually occurring about 30 minutes after eating a meal but she has found no patterns to the type of food that triggers the pain.  She has tried cutting out dairy, high fat, and has been on a clear liquid diet for the past 2 days at the phone advice of Dr. Randa Evens, her GI doctor, but the pain is worsened.  She denies fevers, reports nausea without emesis.  She has a loose stool every 1-2 days since her surgery, no change in this pattern.  She denies dysuria, hematuria, vaginal discharge. She denies etoh use, but does smoke marijuana, last used 24 hours prior to Sunday's onset of pain.  The history is provided by the patient.    Past Medical History:  Diagnosis Date  . Anemia   . Anxiety 2009   ON MEDS X 1 YEAR  . Depression 2009   in HS, ok now  . H/O bacterial infection   . History of chlamydia   . Infection 02/2011   UTI  . Pneumonia    AS CHILD  . Stomach disease 2009   NO MEDS CURRENTLY  . Yeast infection     Patient Active Problem List   Diagnosis Date Noted  . Anemia 01/17/2012    Past Surgical History:  Procedure Laterality Date  . TONSILLECTOMY  2009   T&A  . WISDOM TOOTH EXTRACTION  2007    OB History    Gravida Para Term Preterm AB Living   1 1 1  0 0 1   SAB TAB Ectopic Multiple Live Births   0 0 0 0 1       Home Medications    Prior to Admission medications   Medication Sig Start Date End Date Taking? Authorizing Provider  colestipol (COLESTID) 1 g tablet Take 2 g  by mouth at bedtime. 03/21/16  Yes Historical Provider, MD  etonogestrel (NEXPLANON) 68 MG IMPL implant 1 each by Subdermal route once.   Yes Historical Provider, MD  omeprazole (PRILOSEC) 20 MG capsule Take 20 mg by mouth 2 (two) times daily. 03/14/15  Yes Historical Provider, MD  Vitamin D, Ergocalciferol, (DRISDOL) 50000 units CAPS capsule Take 50,000 Units by mouth every Monday. 03/04/16  Yes Historical Provider, MD  dicyclomine (BENTYL) 20 MG tablet Take 1 tablet (20 mg total) by mouth 2 (two) times daily as needed for spasms. 04/09/16   Burgess Amor, PA-C  HYDROcodone-acetaminophen (NORCO/VICODIN) 5-325 MG tablet Take 1 tablet by mouth every 4 (four) hours as needed for moderate pain. 04/09/16   Burgess Amor, PA-C  ondansetron (ZOFRAN) 4 MG tablet Take 1 tablet (4 mg total) by mouth every 6 (six) hours. 04/09/16   Burgess Amor, PA-C  promethazine (PHENERGAN) 25 MG suppository Place 1 suppository (25 mg total) rectally every 6 (six) hours as needed for nausea or vomiting. Patient not taking: Reported on 04/09/2016 06/15/15   Benjiman Core, MD    Family History Family History  Problem Relation Age of Onset  .  Asthma Other   . Lupus Maternal Aunt   . Hypertension Paternal Aunt   . Fibromyalgia Paternal Aunt   . Mental illness Paternal Aunt   . Mental illness Paternal Uncle   . Hypertension Paternal Uncle   . Arthritis Maternal Grandmother   . Hyperlipidemia Maternal Grandmother   . Hypertension Maternal Grandmother   . Hyperlipidemia Paternal Grandmother   . Hypertension Paternal Grandmother   . Diabetes Paternal Grandfather   . Hyperlipidemia Paternal Grandfather   . Hypertension Paternal Grandfather   . Birth defects Maternal Uncle   . Other Neg Hx     Social History Social History  Substance Use Topics  . Smoking status: Current Every Day Smoker    Packs/day: 1.00    Last attempt to quit: 06/28/2011  . Smokeless tobacco: Never Used  . Alcohol use 0.5 oz/week    1 Standard drinks or  equivalent per week     Comment: social alcohol before pregnancy     Allergies   Tape   Review of Systems Review of Systems  Constitutional: Negative for chills and fever.  HENT: Negative for congestion and sore throat.   Eyes: Negative.   Respiratory: Negative for chest tightness and shortness of breath.   Cardiovascular: Negative for chest pain.  Gastrointestinal: Positive for abdominal pain and nausea. Negative for blood in stool and vomiting.  Genitourinary: Negative.  Negative for dysuria and vaginal discharge.  Musculoskeletal: Negative for arthralgias, joint swelling and neck pain.  Skin: Negative.  Negative for rash and wound.  Neurological: Negative for dizziness, weakness, light-headedness, numbness and headaches.  Psychiatric/Behavioral: Negative.      Physical Exam Updated Vital Signs BP 124/75   Pulse 74   Temp 98.5 F (36.9 C) (Oral)   Resp 16   Ht 5\' 3"  (1.6 m)   Wt 68.9 kg   SpO2 100%   BMI 26.93 kg/m   Physical Exam  Constitutional: She appears well-developed and well-nourished.  HENT:  Head: Normocephalic and atraumatic.  Eyes: Conjunctivae are normal.  Neck: Normal range of motion.  Cardiovascular: Normal rate, regular rhythm, normal heart sounds and intact distal pulses.   Pulmonary/Chest: Effort normal and breath sounds normal. She has no wheezes.  Abdominal: Soft. Bowel sounds are normal. She exhibits no mass. There is tenderness in the right upper quadrant. There is no rigidity, no rebound, no guarding, no CVA tenderness and negative Murphy's sign.  Musculoskeletal: Normal range of motion.  Neurological: She is alert.  Skin: Skin is warm and dry.  Psychiatric: She has a normal mood and affect.  Nursing note and vitals reviewed.    ED Treatments / Results  Labs (all labs ordered are listed, but only abnormal results are displayed) Labs Reviewed  URINALYSIS, ROUTINE W REFLEX MICROSCOPIC - Abnormal; Notable for the following:        Result Value   Color, Urine STRAW (*)    APPearance HAZY (*)    Specific Gravity, Urine 1.003 (*)    Leukocytes, UA SMALL (*)    Bacteria, UA RARE (*)    Squamous Epithelial / LPF 6-30 (*)    All other components within normal limits  COMPREHENSIVE METABOLIC PANEL - Abnormal; Notable for the following:    BUN 5 (*)    All other components within normal limits  PREGNANCY, URINE  CBC WITH DIFFERENTIAL/PLATELET  LIPASE, BLOOD    EKG  EKG Interpretation None       Radiology Dg Abdomen 1 View  Result Date: 04/09/2016  CLINICAL DATA:  Right upper quadrant pain for 1 week, nausea and diarrhea status post cholecystectomy EXAM: ABDOMEN - 1 VIEW COMPARISON:  04/05/2015 FINDINGS: There is normal small bowel gas pattern. Some colonic gas and some stool noted in right colon and proximal transverse colon. Some colonic gas noted in splenic flexure of colon. Mild lower lumbar levoscoliosis. Postcholecystectomy surgical clip. IMPRESSION: Normal small bowel gas pattern. Some colonic stool and gas as described above. Electronically Signed   By: Natasha Mead M.D.   On: 04/09/2016 13:24    Procedures Procedures (including critical care time)  Medications Ordered in ED Medications  ondansetron (ZOFRAN) injection 4 mg (4 mg Intravenous Given 04/09/16 1029)  HYDROmorphone (DILAUDID) injection 0.5 mg (0.5 mg Intravenous Given 04/09/16 1039)  dicyclomine (BENTYL) injection 20 mg (20 mg Intramuscular Given 04/09/16 1229)     Initial Impression / Assessment and Plan / ED Course  I have reviewed the triage vital signs and the nursing notes.  Pertinent labs & imaging results that were available during my care of the patient were reviewed by me and considered in my medical decision making (see chart for details).     Labs and imaging negative.  Chronic RUQ abdominal pain of unclear etiology, suspect functional/IBS.  No acute abdominal findings, repeat exam prior to dc, no guarding or rebound.  Pt to f/u with  Dr. Randa Evens, has appt in 10 days.   The patient appears reasonably screened and/or stabilized for discharge and I doubt any other medical condition or other [facility] requiring further screening, evaluation, or treatment in the ED at this time prior to discharge.   Final Clinical Impressions(s) / ED Diagnoses   Final diagnoses:  Right upper quadrant abdominal pain    New Prescriptions New Prescriptions   DICYCLOMINE (BENTYL) 20 MG TABLET    Take 1 tablet (20 mg total) by mouth 2 (two) times daily as needed for spasms.   HYDROCODONE-ACETAMINOPHEN (NORCO/VICODIN) 5-325 MG TABLET    Take 1 tablet by mouth every 4 (four) hours as needed for moderate pain.   ONDANSETRON (ZOFRAN) 4 MG TABLET    Take 1 tablet (4 mg total) by mouth every 6 (six) hours.     Burgess Amor, PA-C 04/09/16 1339    Raeford Razor, MD 04/14/16 [phone]

## 2016-04-09 NOTE — ED Triage Notes (Signed)
Pt reports a throbbing RUQ pain since Sunday after eating. Pt reports 1 episode of diarrhea and feeling nasueous. Pt sates she had similar pain before having gallbladder out in May 2017.

## 2016-04-17 ENCOUNTER — Other Ambulatory Visit: Payer: Self-pay | Admitting: Gastroenterology

## 2016-04-17 DIAGNOSIS — R1011 Right upper quadrant pain: Secondary | ICD-10-CM

## 2016-04-27 ENCOUNTER — Ambulatory Visit
Admission: RE | Admit: 2016-04-27 | Discharge: 2016-04-27 | Disposition: A | Discharge status: Home / Self Care | Payer: Medicaid Other | Source: Ambulatory Visit | Attending: Gastroenterology | Admitting: Gastroenterology

## 2016-04-27 DIAGNOSIS — R1011 Right upper quadrant pain: Secondary | ICD-10-CM

## 2016-04-27 MED ORDER — GADOBENATE DIMEGLUMINE 529 MG/ML IV SOLN
14.0000 mL | Freq: Once | INTRAVENOUS | Status: AC | PRN
  Administered 2016-04-27: 14 mL via INTRAVENOUS

## 2016-06-18 ENCOUNTER — Encounter (HOSPITAL_COMMUNITY): Payer: Self-pay | Admitting: Family Medicine

## 2016-06-18 ENCOUNTER — Ambulatory Visit (HOSPITAL_COMMUNITY)
Admission: EM | Admit: 2016-06-18 | Discharge: 2016-06-18 | Disposition: A | Discharge status: Home / Self Care | Payer: Medicaid Other | Attending: Family Medicine | Admitting: Family Medicine

## 2016-06-18 DIAGNOSIS — J209 Acute bronchitis, unspecified: Secondary | ICD-10-CM | POA: Diagnosis not present

## 2016-06-18 DIAGNOSIS — J0110 Acute frontal sinusitis, unspecified: Secondary | ICD-10-CM | POA: Diagnosis not present

## 2016-06-18 MED ORDER — HYDROCOD POLST-CPM POLST ER 10-8 MG/5ML PO SUER: 5.0000 mL | Freq: Every evening | ORAL | 0 refills | Status: DC | PRN

## 2016-06-18 MED ORDER — BENZONATATE 100 MG PO CAPS: 100.0000 mg | ORAL_CAPSULE | Freq: Three times a day (TID) | ORAL | 0 refills | Status: DC | PRN

## 2016-06-18 MED ORDER — AMOXICILLIN-POT CLAVULANATE 875-125 MG PO TABS: 1.0000 | ORAL_TABLET | Freq: Two times a day (BID) | ORAL | 0 refills | Status: AC

## 2016-06-18 NOTE — ED Triage Notes (Signed)
Pt here for a cold that started Friday and she has been trying OTC meds. sts that it was better and for the last 2 days cough with burning chest pain and back pain.

## 2016-06-18 NOTE — ED Provider Notes (Signed)
CSN: [id number]     Arrival date & time 06/18/16  1006 History   First MD Initiated Contact with Patient 06/18/16 1058     Chief Complaint  Patient presents with  . Cough   (Consider location/radiation/quality/duration/timing/severity/associated sxs/prior Treatment) 26 year old female presents with nasal congestion, sore throat, and worsening cough for about 6 days. Throat pain has improved but having more frontal sinus tenderness and burning in her chest when she coughs. She denies any fever or GI symptoms. She has taken OTC cold medications with minimal relief. Has history of GERD and insomnia- on daily medication for management.    The history is provided by the patient.    Past Medical History:  Diagnosis Date  . Anemia   . Anxiety 2009   ON MEDS X 1 YEAR  . Depression 2009   in HS, ok now  . H/O bacterial infection   . History of chlamydia   . Infection 02/2011   UTI  . Pneumonia    AS CHILD  . Stomach disease 2009   NO MEDS CURRENTLY  . Yeast infection    Past Surgical History:  Procedure Laterality Date  . TONSILLECTOMY  2009   T&A  . WISDOM TOOTH EXTRACTION  2007   Family History  Problem Relation Age of Onset  . Asthma Other   . Lupus Maternal Aunt   . Hypertension Paternal Aunt   . Fibromyalgia Paternal Aunt   . Mental illness Paternal Aunt   . Mental illness Paternal Uncle   . Hypertension Paternal Uncle   . Arthritis Maternal Grandmother   . Hyperlipidemia Maternal Grandmother   . Hypertension Maternal Grandmother   . Hyperlipidemia Paternal Grandmother   . Hypertension Paternal Grandmother   . Diabetes Paternal Grandfather   . Hyperlipidemia Paternal Grandfather   . Hypertension Paternal Grandfather   . Birth defects Maternal Uncle   . Other Neg Hx    Social History  Substance Use Topics  . Smoking status: Current Every Day Smoker    Packs/day: 1.00    Last attempt to quit: 06/28/2011  . Smokeless tobacco: Never Used  . Alcohol use 0.5 oz/week     1 Standard drinks or equivalent per week     Comment: social alcohol before pregnancy   OB History    Gravida Para Term Preterm AB Living   1 1 1  0 0 1   SAB TAB Ectopic Multiple Live Births   0 0 0 0 1     Review of Systems  Constitutional: Positive for fatigue. Negative for activity change, appetite change, chills and fever.  HENT: Positive for congestion, postnasal drip, rhinorrhea, sinus pressure and sore throat. Negative for ear discharge, ear pain, facial swelling, sinus pain, sneezing and trouble swallowing.   Eyes: Negative for discharge and itching.  Respiratory: Positive for cough and chest tightness. Negative for shortness of breath and wheezing.   Cardiovascular: Positive for chest pain.  Gastrointestinal: Negative for abdominal pain, diarrhea, nausea and vomiting.  Musculoskeletal: Negative for arthralgias, back pain, myalgias, neck pain and neck stiffness.  Skin: Negative for rash and wound.  Allergic/Immunologic: Negative for immunocompromised state.  Neurological: Positive for headaches. Negative for dizziness, syncope, weakness, light-headedness and numbness.  Hematological: Negative for adenopathy.    Allergies  Tape  Home Medications   Prior to Admission medications   Medication Sig Start Date End Date Taking? Authorizing Provider  amoxicillin-clavulanate (AUGMENTIN) 875-125 MG tablet Take 1 tablet by mouth every 12 (twelve) hours. 06/18/16  06/25/16  Sudie GrumblingAmyot, Luba Matzen Berry, NP  benzonatate (TESSALON) 100 MG capsule Take 1 capsule (100 mg total) by mouth 3 (three) times daily as needed for cough. 06/18/16   Sudie GrumblingAmyot, Kaytelyn Glore Berry, NP  chlorpheniramine-HYDROcodone (TUSSIONEX PENNKINETIC ER) 10-8 MG/5ML SUER Take 5 mLs by mouth at bedtime as needed for cough. 06/18/16   Sudie GrumblingAmyot, Emmanuelle Coxe Berry, NP  colestipol (COLESTID) 1 g tablet Take 2 g by mouth at bedtime. 03/21/16   [provider]  etonogestrel (NEXPLANON) 68 MG IMPL implant 1 each by Subdermal route once.    [provider]  omeprazole (PRILOSEC) 20 MG capsule Take 20 mg by mouth 2 (two) times daily. 03/14/15   [provider]  Vitamin D, Ergocalciferol, (DRISDOL) 50000 units CAPS capsule Take 50,000 Units by mouth every Monday. 03/04/16   [provider]   Meds Ordered and Administered this Visit  Medications - No data to display  BP 119/87   Pulse 78   Resp 18   SpO2 100%  No data found.   Physical Exam  Constitutional: She is oriented to person, place, and time. She appears well-developed and well-nourished. She appears ill. No distress.  HENT:  Head: Normocephalic and atraumatic.  Right Ear: Hearing, tympanic membrane, external ear and ear canal normal.  Left Ear: Hearing, tympanic membrane, external ear and ear canal normal.  Nose: Mucosal edema and rhinorrhea present. Right sinus exhibits frontal sinus tenderness. Right sinus exhibits no maxillary sinus tenderness. Left sinus exhibits frontal sinus tenderness. Left sinus exhibits no maxillary sinus tenderness.  Mouth/Throat: Uvula is midline and mucous membranes are normal. Posterior oropharyngeal erythema present.  Neck: Normal range of motion. Neck supple.  Cardiovascular: Normal rate, regular rhythm and normal heart sounds.   No murmur heard. Pulmonary/Chest: Effort normal. No respiratory distress. She has no decreased breath sounds. She has no wheezes. She has rhonchi in the right upper field and the left upper field. She has no rales.  Lymphadenopathy:       Head (right side): No tonsillar and no preauricular adenopathy present.       Head (left side): Tonsillar and preauricular adenopathy present.    She has no cervical adenopathy.  Neurological: She is alert and oriented to person, place, and time.  Skin: Skin is warm and dry. Capillary refill takes less than 2 seconds.  Psychiatric: She has a normal mood and affect. Her behavior is normal. Judgment and thought content normal.    Urgent Care Course      Procedures (including critical care time)  Labs Review Labs Reviewed - No data to display  Imaging Review No results found.   Visual Acuity Review  Right Eye Distance:   Left Eye Distance:   Bilateral Distance:    Right Eye Near:   Left Eye Near:    Bilateral Near:         MDM   1. Acute non-recurrent frontal sinusitis   2. Acute bronchitis, unspecified organism    Temperature not recorded in chart but patient states that it was "normal". Recommend start Augmentin 875mg  twice a day as directed. May use Tessalon cough pills 1 every 8 hours as needed. May also take Tussionex 1 teaspoon at night for cough if needed. [place] Controlled Substance Registry reviewed- last Rx was for Vicodin for 10 pills in March 2018 for abdominal pain- no current Rx. Increase fluid intake to help loosen up mucus. Follow-up with her primary care provider in 4 to 5 days if not improving.  Sudie Grumbling, NP 06/18/16 [phone]

## 2016-06-18 NOTE — Discharge Instructions (Addendum)
Recommend start Augmentin 875mg  twice a day as directed. May use Tessalon cough pills 1 every 8 hours as needed. May also take Tussionex 1 teaspoon at night for cough if needed. Increase fluid intake to help loosen up mucus. Follow-up with your primary care provider in 4 to 5 days if not improving.

## 2016-10-01 IMAGING — US US ABDOMEN COMPLETE
1 series · 13 of 25 positions shown · non-contrast
Comparison: Abdominal pelvic CT scan January 10, 2015.

CLINICAL DATA: Many years of intermittent nausea and vomiting

EXAM:
ABDOMEN ULTRASOUND COMPLETE

[Series 1: us abdomen complete · 0.26mm/px · 13 of 77 slices shown]
[im 1/77]
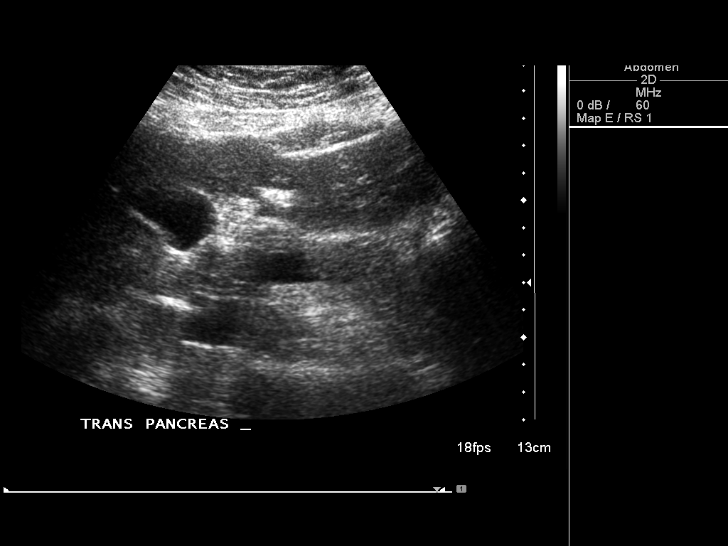
[im 7/77]
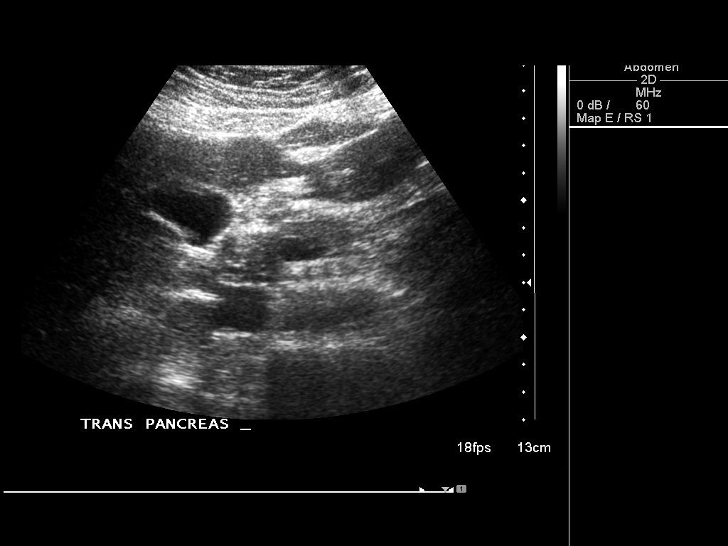
[im 13/77]
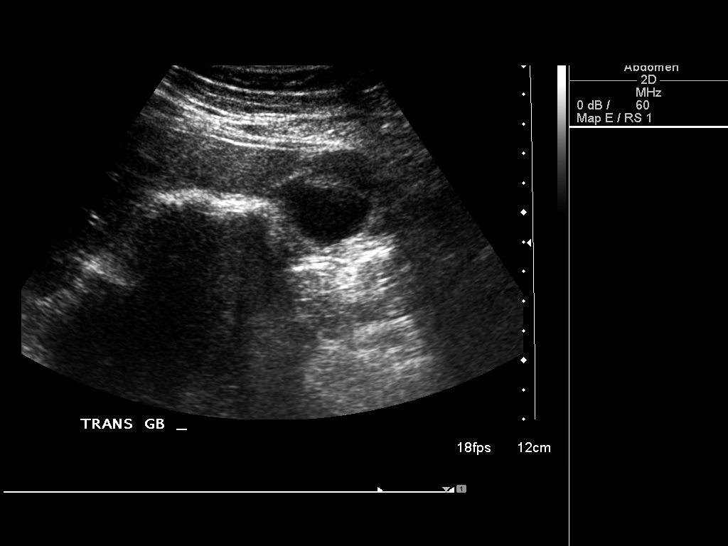
[im 20/77]
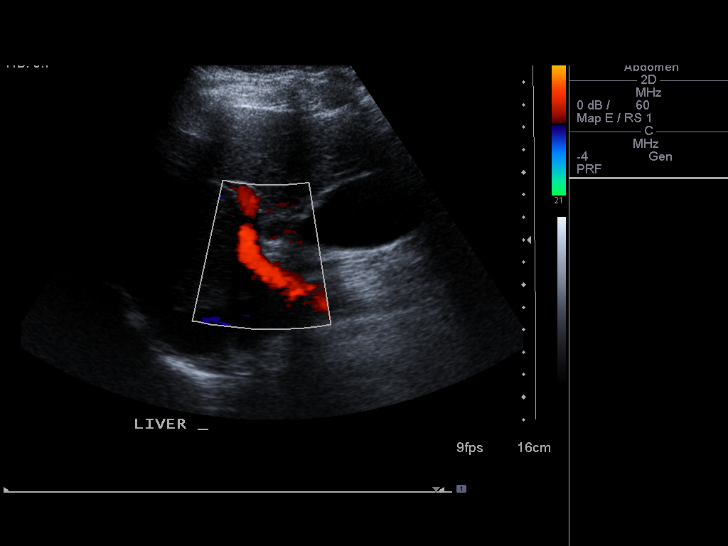
[im 26/77]
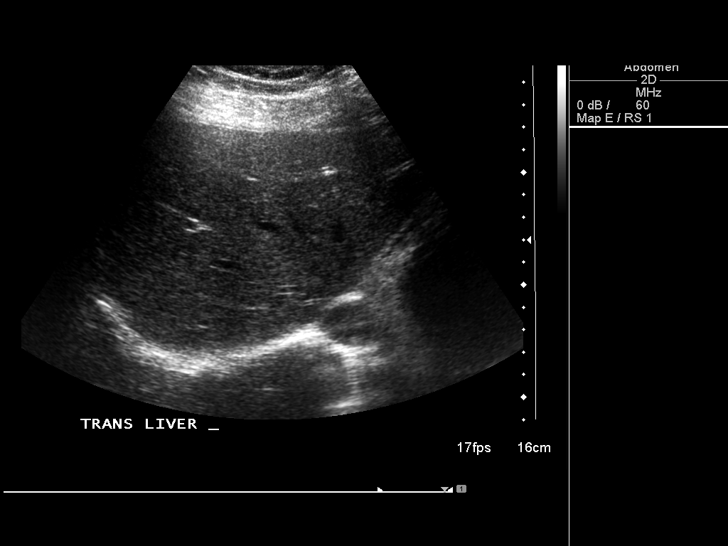
[im 32/77]
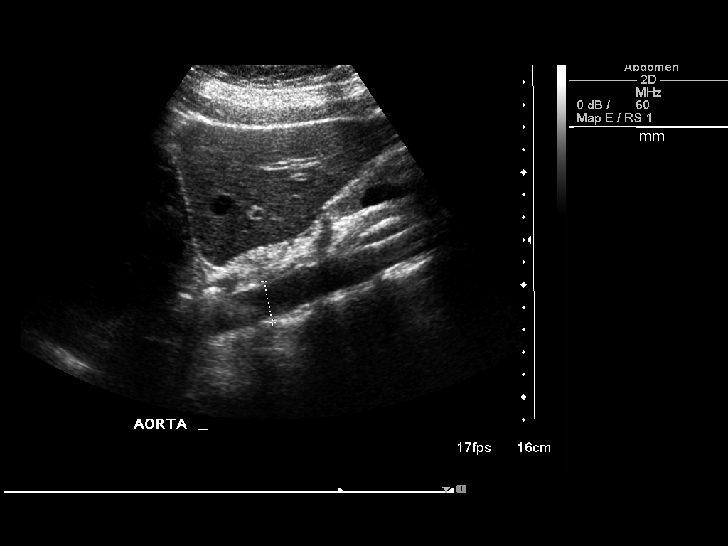
[im 39/77]
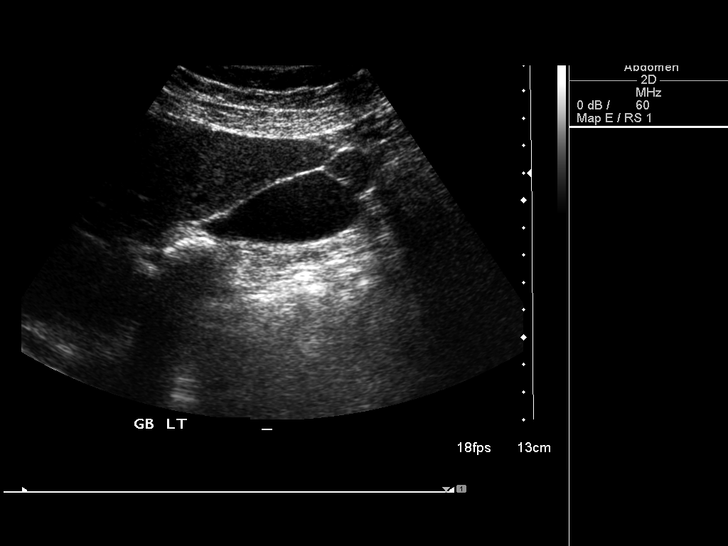
[im 45/77]
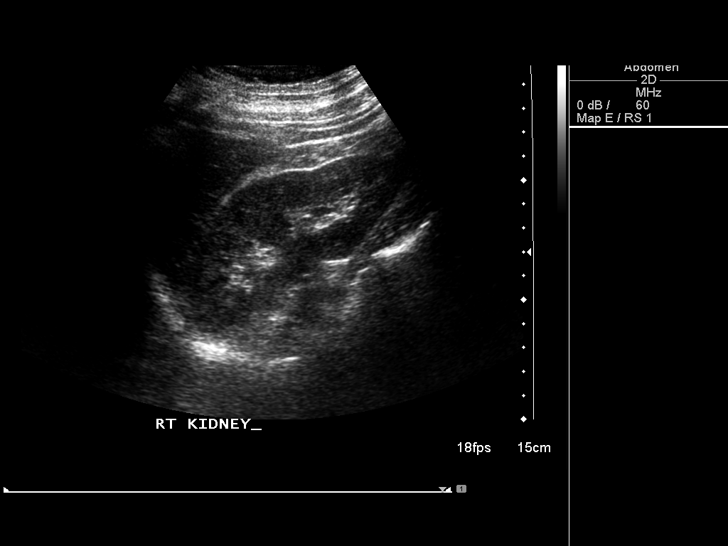
[im 51/77]
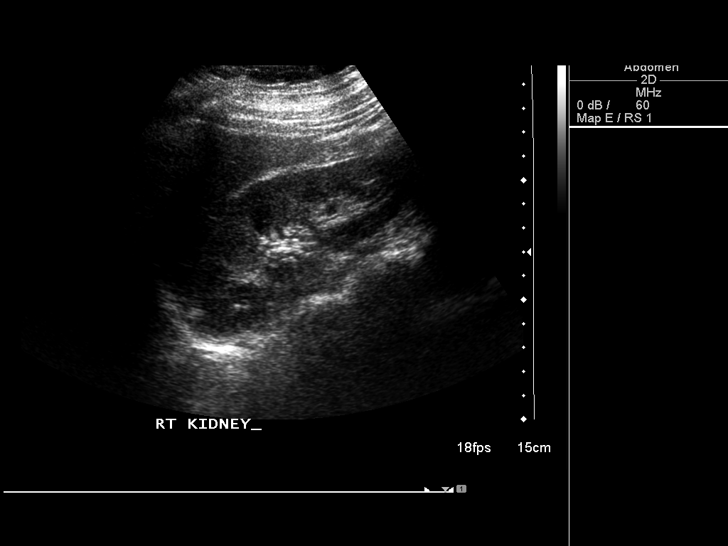
[im 58/77]
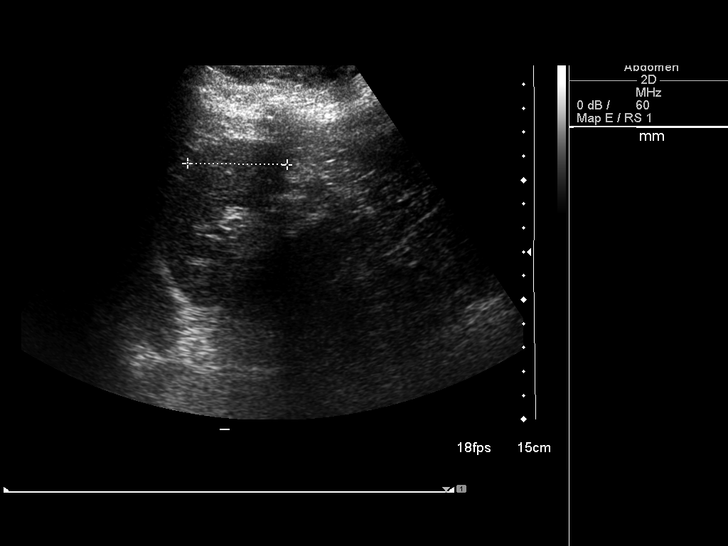
[im 64/77]
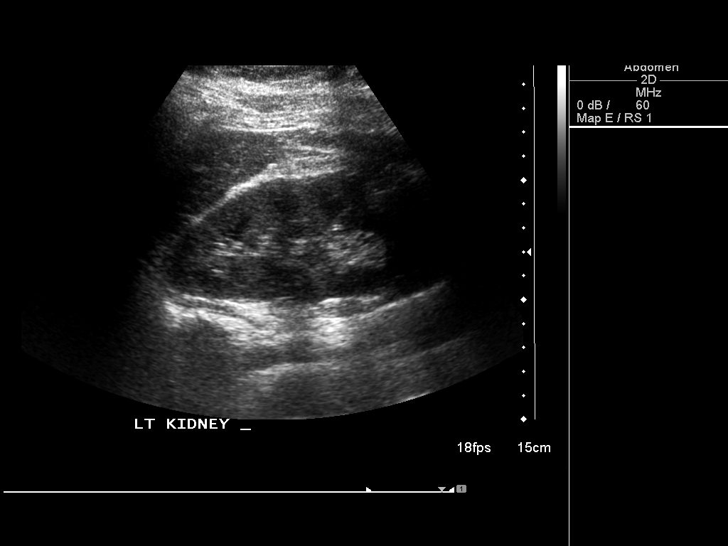
[im 70/77]
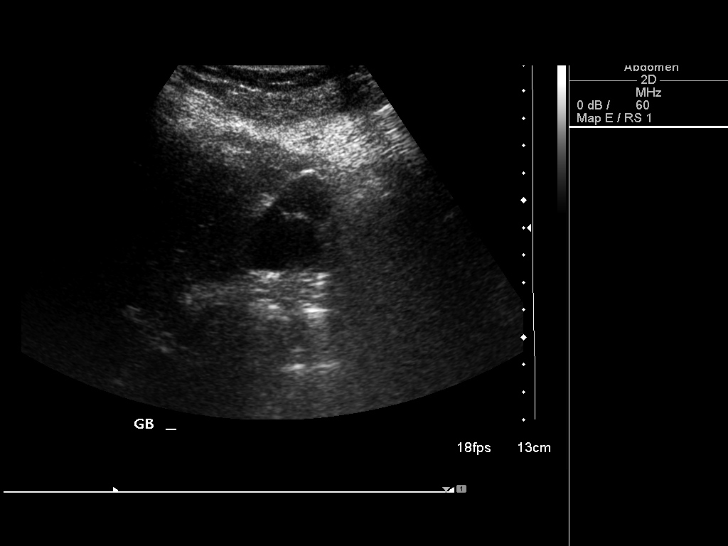
[im 77/77]
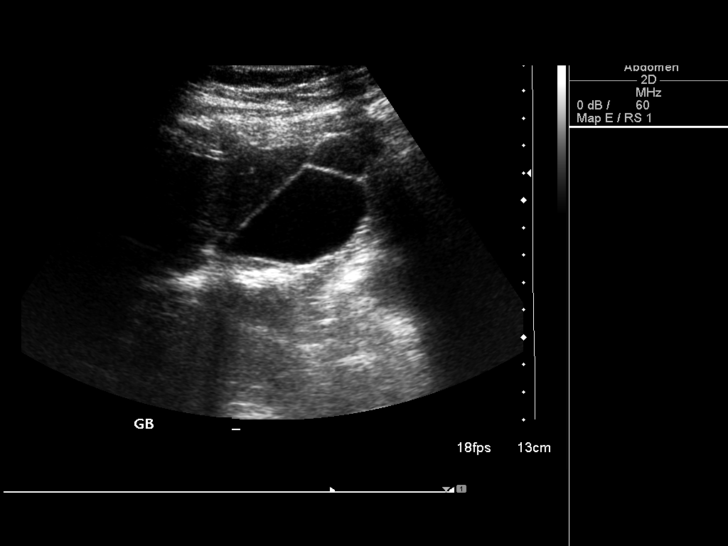

[13 of 25 positions shown; findings below may reference images not displayed]

FINDINGS: Gallbladder: The gallbladder is adequately distended. There is a
Phrygian cap contour were which was also visible on the pre seeding
abdominal CT scan. No stones are evident. There is no definite
sludge. There is no gallbladder wall thickening, pericholecystic
fluid, or positive sonographic Murphy's sign.

Common bile duct: Diameter: 5.4 mm

Liver: The liver exhibits normal echotexture with no focal mass nor
ductal dilation.

IVC: No abnormality visualized.

Pancreas: Visualized portion unremarkable.

Spleen: 4.2 cm.  Normal echotexture.

Right Kidney: Length: 11 cm. Echogenicity within normal limits. No
mass or hydronephrosis visualized.

Left Kidney: Length: 11 cm. Echogenicity within normal limits. No
mass or hydronephrosis visualized.

Abdominal aorta: No aneurysm visualized.

Other findings: There is no ascites
IMPRESSION: 1. No evidence of gallstones. If there are clinical concerns of
chronic gallbladder dysfunction, a nuclear medicine hepatobiliary
scan may be useful. The remainder of the hepatobiliary tree is also
unremarkable.
2. No acute abnormality observed within the abdomen.

## 2016-10-29 IMAGING — NM NM HEPATO W/GB/PHARM/[PERSON_NAME]
2 series · 12 of 12 positions shown · non-contrast
Comparison: CT abdomen and pelvis 01/10/2015. Abdominal ultrasound
03/23/2015.

CLINICAL DATA: Intermittent right upper quadrant pain for 1 year.
Subsequent encounter.

EXAM:
NUCLEAR MEDICINE HEPATOBILIARY IMAGING WITH GALLBLADDER EF
TECHNIQUE: Sequential images of the abdomen were obtained [DATE] minutes
following intravenous administration of radiopharmaceutical. After
oral ingestion of Ensure, gallbladder ejection fraction was
determined. At 60 min, normal ejection fraction is greater than 33%.
RADIOPHARMACEUTICALS:  Five mCi [id number]  Choletec IV

[gb ef · 3.43mm/px · 6 of 60 frames shown]
[frame 6/60]
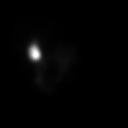
[frame 16/60]
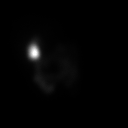
[frame 26/60]
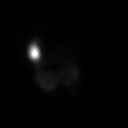
[frame 36/60]
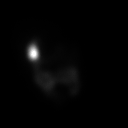
[frame 46/60]
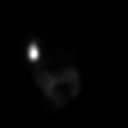
[frame 56/60]
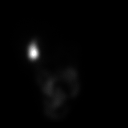

[anterior · 3.43mm/px · 6 of 60 frames shown]
[frame 6/60]
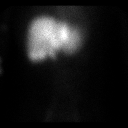
[frame 16/60]
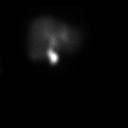
[frame 26/60]
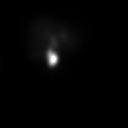
[frame 36/60]
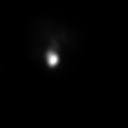
[frame 46/60]
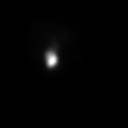
[frame 56/60]
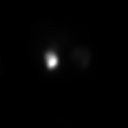

[12 of 12 positions shown; findings below may reference images not displayed]

FINDINGS: The liver demonstrates prompt, homogeneous radiotracer uptake. The
gallbladder is visualized at 20 minutes. Radiotracer flows into
bowel. Gallbladder ejection fraction: 29%. Normal gallbladder
ejection fraction with Ensure is greater than 33%.
IMPRESSION: Gallbladder ejection fraction is slightly lower than normal. The
study is otherwise negative.

## 2017-01-26 ENCOUNTER — Encounter (HOSPITAL_COMMUNITY): Payer: Self-pay | Admitting: *Deleted

## 2017-01-26 ENCOUNTER — Other Ambulatory Visit: Payer: Self-pay

## 2017-01-26 ENCOUNTER — Emergency Department (HOSPITAL_COMMUNITY)
Admission: EM | Admit: 2017-01-26 | Discharge: 2017-01-26 | Disposition: A | Discharge status: Home / Self Care | Payer: Medicaid Other | Attending: Emergency Medicine | Admitting: Emergency Medicine

## 2017-01-26 DIAGNOSIS — F419 Anxiety disorder, unspecified: Secondary | ICD-10-CM | POA: Insufficient documentation

## 2017-01-26 DIAGNOSIS — R112 Nausea with vomiting, unspecified: Secondary | ICD-10-CM | POA: Insufficient documentation

## 2017-01-26 DIAGNOSIS — Z79899 Other long term (current) drug therapy: Secondary | ICD-10-CM | POA: Diagnosis not present

## 2017-01-26 DIAGNOSIS — F17200 Nicotine dependence, unspecified, uncomplicated: Secondary | ICD-10-CM | POA: Diagnosis not present

## 2017-01-26 DIAGNOSIS — R1010 Upper abdominal pain, unspecified: Secondary | ICD-10-CM | POA: Insufficient documentation

## 2017-01-26 DIAGNOSIS — R197 Diarrhea, unspecified: Secondary | ICD-10-CM | POA: Diagnosis not present

## 2017-01-26 LAB — URINALYSIS, ROUTINE W REFLEX MICROSCOPIC
Bilirubin Urine: NEGATIVE
Glucose, UA: NEGATIVE mg/dL
Hgb urine dipstick: NEGATIVE
Ketones, ur: 20 mg/dL — AB
Leukocytes, UA: NEGATIVE
Nitrite: NEGATIVE
Protein, ur: 100 mg/dL — AB
SPECIFIC GRAVITY, URINE: 1.021 (ref 1.005–1.030)
pH: 7 (ref 5.0–8.0)

## 2017-01-26 LAB — CBC
HCT: 40 % (ref 36.0–46.0)
Hemoglobin: 14.1 g/dL (ref 12.0–15.0)
MCH: 31.3 pg (ref 26.0–34.0)
MCHC: 35.3 g/dL (ref 30.0–36.0)
MCV: 88.7 fL (ref 78.0–100.0)
PLATELETS: 428 10*3/uL — AB (ref 150–400)
RBC: 4.51 MIL/uL (ref 3.87–5.11)
RDW: 12.7 % (ref 11.5–15.5)
WBC: 11.4 10*3/uL — ABNORMAL HIGH (ref 4.0–10.5)

## 2017-01-26 LAB — I-STAT BETA HCG BLOOD, ED (MC, WL, AP ONLY): I-stat hCG, quantitative: <5 m[IU]/mL (ref <5)

## 2017-01-26 LAB — COMPREHENSIVE METABOLIC PANEL
ALK PHOS: 49 U/L (ref 38–126)
ALT: 14 U/L (ref 14–54)
AST: 31 U/L (ref 15–41)
Albumin: 4.8 g/dL (ref 3.5–5.0)
Anion gap: 13 (ref 5–15)
BUN: 6 mg/dL (ref 6–20)
CALCIUM: 9.7 mg/dL (ref 8.9–10.3)
CHLORIDE: 106 mmol/L (ref 101–111)
CO2: 20 mmol/L — AB (ref 22–32)
Creatinine, Ser: 0.75 mg/dL (ref 0.44–1.00)
GFR calc Af Amer: >60 mL/min (ref >60)
GFR calc non Af Amer: >60 mL/min (ref >60)
Glucose, Bld: 147 mg/dL — ABNORMAL HIGH (ref 65–99)
Potassium: 3.3 mmol/L — ABNORMAL LOW (ref 3.5–5.1)
SODIUM: 139 mmol/L (ref 135–145)
Total Bilirubin: 0.9 mg/dL (ref 0.3–1.2)
Total Protein: 8.4 g/dL — ABNORMAL HIGH (ref 6.5–8.1)

## 2017-01-26 LAB — LIPASE, BLOOD: LIPASE: 20 U/L (ref 11–51)

## 2017-01-26 MED ORDER — LORAZEPAM 2 MG/ML IJ SOLN
0.5000 mg | Freq: Once | INTRAMUSCULAR | Status: AC
  Administered 2017-01-26: 0.5 mg via INTRAVENOUS
  Filled 2017-01-26: qty 1

## 2017-01-26 MED ORDER — SODIUM CHLORIDE 0.9 % IV BOLUS (SEPSIS)
1000.0000 mL | Freq: Once | INTRAVENOUS | Status: AC
  Administered 2017-01-26: 1000 mL via INTRAVENOUS

## 2017-01-26 MED ORDER — METOCLOPRAMIDE HCL 5 MG/ML IJ SOLN
10.0000 mg | Freq: Once | INTRAMUSCULAR | Status: AC
  Administered 2017-01-26: 10 mg via INTRAVENOUS
  Filled 2017-01-26: qty 2

## 2017-01-26 MED ORDER — ONDANSETRON 4 MG PO TBDP: ORAL_TABLET | ORAL | 0 refills | Status: DC

## 2017-01-26 MED ORDER — MORPHINE SULFATE (PF) 4 MG/ML IV SOLN
4.0000 mg | Freq: Once | INTRAVENOUS | Status: AC
  Administered 2017-01-26: 4 mg via INTRAVENOUS
  Filled 2017-01-26: qty 1

## 2017-01-26 MED ORDER — SUCRALFATE 1 G PO TABS: 1.0000 g | ORAL_TABLET | Freq: Three times a day (TID) | ORAL | 0 refills | Status: DC

## 2017-01-26 MED ORDER — GI COCKTAIL ~~LOC~~
30.0000 mL | Freq: Once | ORAL | Status: AC
  Administered 2017-01-26: 30 mL via ORAL
  Filled 2017-01-26: qty 30

## 2017-01-26 NOTE — ED Triage Notes (Signed)
The  Pt is c/o severe abd pain since 2100 vomiting in triage and hyperventilating hx of the same  lmp implant

## 2017-01-26 NOTE — ED Provider Notes (Signed)
[facility] Provider Note   CSN: [id number] Arrival date & time: 01/26/17  0549     History   Chief Complaint Chief Complaint  Patient presents with  . Abdominal Pain    HPI Diane Kent is a 26 y.o. female.  HPI Patient presents with upper abdominal pain starting at 9:00 yesterday evening after eating [place] food.  Associated with multiple episodes of vomiting and 2 episodes of loose stool.  Has had similar symptoms in the past.  Is followed by Dr. Randa EvensEdwards.  Has had a cholecystectomy last year with no resolution of her symptoms. Past Medical History:  Diagnosis Date  . Anemia   . Anxiety 2009   ON MEDS X 1 YEAR  . Depression 2009   in HS, ok now  . H/O bacterial infection   . History of chlamydia   . Infection 02/2011   UTI  . Pneumonia    AS CHILD  . Stomach disease 2009   NO MEDS CURRENTLY  . Yeast infection     Patient Active Problem List   Diagnosis Date Noted  . Anemia 01/17/2012    Past Surgical History:  Procedure Laterality Date  . TONSILLECTOMY  2009   T&A  . WISDOM TOOTH EXTRACTION  2007    OB History    Gravida Para Term Preterm AB Living   1 1 1  0 0 1   SAB TAB Ectopic Multiple Live Births   0 0 0 0 1       Home Medications    Prior to Admission medications   Medication Sig Start Date End Date Taking? Authorizing Provider  benzonatate (TESSALON) 100 MG capsule Take 1 capsule (100 mg total) by mouth 3 (three) times daily as needed for cough. 06/18/16  Yes Amyot, Ali LoweAnn Berry, NP  busPIRone (BUSPAR) 7.5 MG tablet Take 7.5 mg by mouth daily as needed (anxiety).   Yes [provider]  chlorpheniramine-HYDROcodone (TUSSIONEX PENNKINETIC ER) 10-8 MG/5ML SUER Take 5 mLs by mouth at bedtime as needed for cough. 06/18/16  Yes Amyot, Ali LoweAnn Berry, NP  colestipol (COLESTID) 1 g tablet Take 2 g by mouth at bedtime. 03/21/16  Yes [provider]  etonogestrel (NEXPLANON) 68 MG IMPL implant 1 each  by Subdermal route once.   Yes [provider]  omeprazole (PRILOSEC) 20 MG capsule Take 20 mg by mouth 2 (two) times daily. 03/14/15  Yes [provider]  ondansetron (ZOFRAN ODT) 4 MG disintegrating tablet 4mg  ODT q4 hours prn nausea/vomit 01/26/17   Loren RacerYelverton, Enora Trillo, MD  sucralfate (CARAFATE) 1 g tablet Take 1 tablet (1 g total) by mouth 4 (four) times daily -  with meals and at bedtime. 01/26/17   Loren RacerYelverton, Yarethzy Croak, MD    Family History Family History  Problem Relation Age of Onset  . Asthma Other   . Lupus Maternal Aunt   . Hypertension Paternal Aunt   . Fibromyalgia Paternal Aunt   . Mental illness Paternal Aunt   . Mental illness Paternal Uncle   . Hypertension Paternal Uncle   . Arthritis Maternal Grandmother   . Hyperlipidemia Maternal Grandmother   . Hypertension Maternal Grandmother   . Hyperlipidemia Paternal Grandmother   . Hypertension Paternal Grandmother   . Diabetes Paternal Grandfather   . Hyperlipidemia Paternal Grandfather   . Hypertension Paternal Grandfather   . Birth defects Maternal Uncle   . Other Neg Hx     Social History Social History   Tobacco Use  .  Smoking status: Current Every Day Smoker    Packs/day: 1.00    Last attempt to quit: 06/28/2011    Years since quitting: 5.5  . Smokeless tobacco: Never Used  Substance Use Topics  . Alcohol use: Yes    Alcohol/week: 0.5 oz    Types: 1 Standard drinks or equivalent per week    Comment: social alcohol before pregnancy  . Drug use: No    Comment: D/C'D WITH PREGNANCY     Allergies   Tape   Review of Systems Review of Systems  Constitutional: Negative for chills and fever.  Respiratory: Negative for shortness of breath.   Cardiovascular: Negative for chest pain.  Gastrointestinal: Positive for abdominal pain, diarrhea, nausea and vomiting. Negative for blood in stool.  Genitourinary: Negative for dysuria, flank pain and pelvic pain.  Musculoskeletal: Negative for back pain,  neck pain and neck stiffness.  Skin: Negative for rash and wound.  Neurological: Negative for dizziness, weakness, light-headedness, numbness and headaches.  All other systems reviewed and are negative.    Physical Exam Updated Vital Signs BP 112/78   Pulse 75   Temp 97.9 F (36.6 C) (Oral)   Resp 20   Ht 5\' 2"  (1.575 m)   Wt 63.5 kg (140 lb)   SpO2 100%   BMI 25.61 kg/m   Physical Exam  Constitutional: She is oriented to person, place, and time. She appears well-developed and well-nourished. She appears distressed.  Patient is writhing on stretcher.  HENT:  Head: Normocephalic and atraumatic.  Mouth/Throat: Oropharynx is clear and moist.  Eyes: EOM are normal. Pupils are equal, round, and reactive to light.  Neck: Normal range of motion. Neck supple.  Cardiovascular: Normal rate and regular rhythm.  Pulmonary/Chest: Effort normal and breath sounds normal. No stridor. No respiratory distress. She has no wheezes. She has no rales. She exhibits no tenderness.  Abdominal: Soft. Bowel sounds are normal. There is tenderness. There is no rebound and no guarding.  Left upper quadrant, epigastric and right upper quadrant tenderness to palpation.  Appears most pronounced in the epigastrium.  No rebound or guarding.  Musculoskeletal: Normal range of motion. She exhibits no edema or tenderness.  No CVA tenderness bilaterally.  Neurological: She is alert and oriented to person, place, and time.  Moving all extremities without focal deficit.  Sensation fully intact.  Skin: Skin is warm and dry. Capillary refill takes less than 2 seconds. No rash noted. No erythema.  Psychiatric: Her behavior is normal.  Anxious appearing  Nursing note and vitals reviewed.    ED Treatments / Results  Labs (all labs ordered are listed, but only abnormal results are displayed) Labs Reviewed  COMPREHENSIVE METABOLIC PANEL - Abnormal; Notable for the following components:      Result Value   Potassium  3.3 (*)    CO2 20 (*)    Glucose, Bld 147 (*)    Total Protein 8.4 (*)    All other components within normal limits  CBC - Abnormal; Notable for the following components:   WBC 11.4 (*)    Platelets 428 (*)    All other components within normal limits  URINALYSIS, ROUTINE W REFLEX MICROSCOPIC - Abnormal; Notable for the following components:   Ketones, ur 20 (*)    Protein, ur 100 (*)    Bacteria, UA RARE (*)    Squamous Epithelial / LPF 0-5 (*)    All other components within normal limits  LIPASE, BLOOD  I-STAT BETA HCG BLOOD, ED (  [facility], [facility], [facility] ONLY)    EKG  EKG Interpretation None       Radiology No results found.  Procedures Procedures (including critical care time)  Medications Ordered in ED Medications  LORazepam (ATIVAN) injection 0.5 mg (0.5 mg Intravenous Given 01/26/17 0809)  metoCLOPramide (REGLAN) injection 10 mg (10 mg Intravenous Given 01/26/17 0808)  morphine 4 MG/ML injection 4 mg (4 mg Intravenous Given 01/26/17 0806)  sodium chloride 0.9 % bolus 1,000 mL (0 mLs Intravenous Stopped 01/26/17 1028)  sodium chloride 0.9 % bolus 1,000 mL (1,000 mLs Intravenous New Bag/Given 01/26/17 1116)  morphine 4 MG/ML injection 4 mg (4 mg Intravenous Given 01/26/17 1116)  gi cocktail (Maalox,Lidocaine,Donnatal) (30 mLs Oral Given 01/26/17 1116)     Initial Impression / Assessment and Plan / ED Course  I have reviewed the triage vital signs and the nursing notes.  Pertinent labs & imaging results that were available during my care of the patient were reviewed by me and considered in my medical decision making (see chart for details).     States she is feeling much better after GI cocktail and IV medication.  She is received 2 L of IV fluids.  No further vomiting.  Question possible gastritis.  Advised to continue on PPI.  Will start on Carafate and have follow-up closely with her gastroenterologist.  Return precautions have been given.  Final Clinical Impressions(s) /  ED Diagnoses   Final diagnoses:  Pain of upper abdomen    ED Discharge Orders        Ordered    sucralfate (CARAFATE) 1 g tablet  3 times daily with meals & bedtime     01/26/17 1337    ondansetron (ZOFRAN ODT) 4 MG disintegrating tablet     01/26/17 1337       Loren RacerYelverton, Ashford Clouse, MD 01/26/17 1340

## 2017-01-30 ENCOUNTER — Other Ambulatory Visit: Payer: Self-pay | Admitting: Physician Assistant

## 2017-01-30 DIAGNOSIS — G43A0 Cyclical vomiting, not intractable: Secondary | ICD-10-CM

## 2017-01-30 DIAGNOSIS — D72829 Elevated white blood cell count, unspecified: Secondary | ICD-10-CM

## 2017-01-30 DIAGNOSIS — R1013 Epigastric pain: Secondary | ICD-10-CM

## 2017-01-30 DIAGNOSIS — R1033 Periumbilical pain: Secondary | ICD-10-CM

## 2017-02-05 ENCOUNTER — Ambulatory Visit
Admission: RE | Admit: 2017-02-05 | Discharge: 2017-02-05 | Disposition: A | Discharge status: Home / Self Care | Payer: Medicaid Other | Source: Ambulatory Visit | Attending: Physician Assistant | Admitting: Physician Assistant

## 2017-02-05 DIAGNOSIS — D72829 Elevated white blood cell count, unspecified: Secondary | ICD-10-CM

## 2017-02-05 DIAGNOSIS — G43A0 Cyclical vomiting, not intractable: Secondary | ICD-10-CM

## 2017-02-05 DIAGNOSIS — R1033 Periumbilical pain: Secondary | ICD-10-CM

## 2017-02-05 DIAGNOSIS — R1013 Epigastric pain: Secondary | ICD-10-CM

## 2017-02-05 MED ORDER — IOPAMIDOL (ISOVUE-370) INJECTION 76%: 75.0000 mL | Freq: Once | INTRAVENOUS | Status: DC | PRN

## 2017-02-05 MED ORDER — IOPAMIDOL (ISOVUE-300) INJECTION 61%
125.0000 mL | Freq: Once | INTRAVENOUS | Status: AC | PRN
  Administered 2017-02-05: 125 mL via INTRAVENOUS

## 2017-03-17 ENCOUNTER — Encounter (HOSPITAL_COMMUNITY): Payer: Self-pay | Admitting: Family Medicine

## 2017-03-17 ENCOUNTER — Other Ambulatory Visit: Payer: Self-pay

## 2017-03-17 ENCOUNTER — Ambulatory Visit (HOSPITAL_COMMUNITY)
Admission: EM | Admit: 2017-03-17 | Discharge: 2017-03-17 | Disposition: A | Discharge status: Home / Self Care | Payer: Medicaid Other | Attending: Family Medicine | Admitting: Family Medicine

## 2017-03-17 DIAGNOSIS — J069 Acute upper respiratory infection, unspecified: Secondary | ICD-10-CM

## 2017-03-17 NOTE — ED Provider Notes (Signed)
[facility]   [id number] 03/17/17 Arrival Time: 1000  ASSESSMENT & PLAN:  1. Viral upper respiratory tract infection    Discussed typical duration of symptoms. OTC symptom care as needed; Afrin qhs. Ensure adequate fluid intake and rest. May f/u with PCP or here as needed.  Reviewed expectations re: course of current medical issues. Questions answered. Outlined signs and symptoms indicating need for more acute intervention. Patient verbalized understanding. After Visit Summary given.   SUBJECTIVE: History from: patient.  Diane Kent is a 27 y.o. female who presents with complaint of nasal congestion, post-nasal drainage, and a persistent dry cough. Onset abrupt, approximately 4 days ago. Overall fatigued without body aches. SOB: none. Wheezing: none. Fever: no. Overall normal PO intake without n/v. Sick contacts: no. OTC treatment: decongestant with mild help.   Social History   Tobacco Use  Smoking Status Current Every Day Smoker  . Packs/day: 1.00  . Last attempt to quit: 06/28/2011  . Years since quitting: 5.7  Smokeless Tobacco Never Used    ROS: As per HPI.   OBJECTIVE:  Vitals:   03/17/17 1024  BP: (!) 150/109  Pulse: 73  Temp: 98.8 F (37.1 C)  TempSrc: Oral  SpO2: 100%    General appearance: alert; appears fatigued HEENT: nasal congestion; clear runny nose; throat irritation secondary to post-nasal drainage Neck: supple without LAD Lungs: unlabored respirations, symmetrical air entry; cough: mild; no respiratory distress Skin: warm and dry Psychological: alert and cooperative; normal mood and affect   Allergies  Allergen Reactions  . Tape Rash    Past Medical History:  Diagnosis Date  . Anemia   . Anxiety 2009   ON MEDS X 1 YEAR  . Depression 2009   in HS, ok now  . H/O bacterial infection   . History of chlamydia   . Infection 02/2011   UTI  . Pneumonia    AS CHILD  . Stomach disease 2009   NO MEDS CURRENTLY  . Yeast  infection    Family History  Problem Relation Age of Onset  . Asthma Other   . Lupus Maternal Aunt   . Hypertension Paternal Aunt   . Fibromyalgia Paternal Aunt   . Mental illness Paternal Aunt   . Mental illness Paternal Uncle   . Hypertension Paternal Uncle   . Arthritis Maternal Grandmother   . Hyperlipidemia Maternal Grandmother   . Hypertension Maternal Grandmother   . Hyperlipidemia Paternal Grandmother   . Hypertension Paternal Grandmother   . Diabetes Paternal Grandfather   . Hyperlipidemia Paternal Grandfather   . Hypertension Paternal Grandfather   . Birth defects Maternal Uncle   . Other Neg Hx    Social History   Socioeconomic History  . Marital status: Single    Spouse name: Not on file  . Number of children: Not on file  . Years of education: Not on file  . Highest education level: Not on file  Social Needs  . Financial resource strain: Not on file  . Food insecurity - worry: Not on file  . Food insecurity - inability: Not on file  . Transportation needs - medical: Not on file  . Transportation needs - non-medical: Not on file  Occupational History  . Occupation: HOME  Tobacco Use  . Smoking status: Current Every Day Smoker    Packs/day: 1.00    Last attempt to quit: 06/28/2011    Years since quitting: 5.7  . Smokeless tobacco: Never Used  Substance and Sexual Activity  .  Alcohol use: Yes    Alcohol/week: 0.5 oz    Types: 1 Standard drinks or equivalent per week    Comment: social alcohol before pregnancy  . Drug use: No    Comment: D/C'D WITH PREGNANCY  . Sexual activity: Yes    Partners: Male  Other Topics Concern  . Not on file  Social History Narrative  . Not on file           Mardella LaymanHagler, Cadell Gabrielson, MD 03/17/17 1207

## 2017-03-17 NOTE — ED Triage Notes (Signed)
Pt reports nasal congestion and a scratchy throat since Saturday.  She reports her daughter was diagnosed with the flu last week.  Pt denies any fever or cough.  She just started with taking a decongestant yesterday and has been using saline nasal spray.

## 2017-03-17 NOTE — Discharge Instructions (Signed)
You may use over the counter Afrin 12 hour nasal spray. Do not use more than 3 days in a row to avoid rebound congestion.

## 2018-03-25 ENCOUNTER — Other Ambulatory Visit: Payer: Self-pay | Admitting: Family Medicine

## 2018-03-25 DIAGNOSIS — R1084 Generalized abdominal pain: Secondary | ICD-10-CM

## 2018-03-26 ENCOUNTER — Other Ambulatory Visit: Payer: Self-pay | Admitting: Family Medicine

## 2018-03-29 ENCOUNTER — Ambulatory Visit
Admission: RE | Admit: 2018-03-29 | Discharge: 2018-03-29 | Disposition: A | Discharge status: Home / Self Care | Payer: Medicaid Other | Source: Ambulatory Visit | Attending: Family Medicine | Admitting: Family Medicine

## 2018-03-29 DIAGNOSIS — R1084 Generalized abdominal pain: Secondary | ICD-10-CM

## 2021-04-04 ENCOUNTER — Other Ambulatory Visit: Payer: Self-pay

## 2021-04-04 ENCOUNTER — Emergency Department (HOSPITAL_BASED_OUTPATIENT_CLINIC_OR_DEPARTMENT_OTHER): Payer: 59

## 2021-04-04 ENCOUNTER — Encounter (HOSPITAL_BASED_OUTPATIENT_CLINIC_OR_DEPARTMENT_OTHER): Payer: Self-pay

## 2021-04-04 ENCOUNTER — Emergency Department (HOSPITAL_BASED_OUTPATIENT_CLINIC_OR_DEPARTMENT_OTHER)
Admission: EM | Admit: 2021-04-04 | Discharge: 2021-04-04 | Disposition: A | Discharge status: Home / Self Care | Payer: 59 | Attending: Emergency Medicine | Admitting: Emergency Medicine

## 2021-04-04 ENCOUNTER — Other Ambulatory Visit (HOSPITAL_BASED_OUTPATIENT_CLINIC_OR_DEPARTMENT_OTHER): Payer: Self-pay

## 2021-04-04 DIAGNOSIS — Z91040 Latex allergy status: Secondary | ICD-10-CM | POA: Diagnosis not present

## 2021-04-04 DIAGNOSIS — R1013 Epigastric pain: Secondary | ICD-10-CM | POA: Insufficient documentation

## 2021-04-04 DIAGNOSIS — R1012 Left upper quadrant pain: Secondary | ICD-10-CM | POA: Insufficient documentation

## 2021-04-04 DIAGNOSIS — R112 Nausea with vomiting, unspecified: Secondary | ICD-10-CM | POA: Diagnosis not present

## 2021-04-04 LAB — URINALYSIS, ROUTINE W REFLEX MICROSCOPIC
Bilirubin Urine: NEGATIVE
Glucose, UA: NEGATIVE mg/dL
Ketones, ur: NEGATIVE mg/dL
Nitrite: NEGATIVE
Protein, ur: 30 mg/dL — AB
Specific Gravity, Urine: 1.033 — ABNORMAL HIGH (ref 1.005–1.030)
pH: 6 (ref 5.0–8.0)

## 2021-04-04 LAB — CBC WITH DIFFERENTIAL/PLATELET
Abs Immature Granulocytes: 0.04 10*3/uL (ref 0.00–0.07)
Basophils Absolute: 0 10*3/uL (ref 0.0–0.1)
Basophils Relative: 0 %
Eosinophils Absolute: 0 10*3/uL (ref 0.0–0.5)
Eosinophils Relative: 0 %
HCT: 42.2 % (ref 36.0–46.0)
Hemoglobin: 14.1 g/dL (ref 12.0–15.0)
Immature Granulocytes: 0 %
Lymphocytes Relative: 14 %
Lymphs Abs: 1.3 10*3/uL (ref 0.7–4.0)
MCH: 28.8 pg (ref 26.0–34.0)
MCHC: 33.4 g/dL (ref 30.0–36.0)
MCV: 86.3 fL (ref 80.0–100.0)
Monocytes Absolute: 0.6 10*3/uL (ref 0.1–1.0)
Monocytes Relative: 7 %
Neutro Abs: 7.3 10*3/uL (ref 1.7–7.7)
Neutrophils Relative %: 79 %
Platelets: 513 10*3/uL — ABNORMAL HIGH (ref 150–400)
RBC: 4.89 MIL/uL (ref 3.87–5.11)
RDW: 13.5 % (ref 11.5–15.5)
WBC: 9.3 10*3/uL (ref 4.0–10.5)
nRBC: 0 % (ref 0.0–0.2)

## 2021-04-04 LAB — COMPREHENSIVE METABOLIC PANEL
ALT: 20 U/L (ref 0–44)
AST: 26 U/L (ref 15–41)
Albumin: 4.6 g/dL (ref 3.5–5.0)
Alkaline Phosphatase: 45 U/L (ref 38–126)
Anion gap: 11 (ref 5–15)
BUN: 8 mg/dL (ref 6–20)
CO2: 21 mmol/L — ABNORMAL LOW (ref 22–32)
Calcium: 8.8 mg/dL — ABNORMAL LOW (ref 8.9–10.3)
Chloride: 105 mmol/L (ref 98–111)
Creatinine, Ser: 0.58 mg/dL (ref 0.44–1.00)
GFR, Estimated: >60 mL/min (ref >60)
Glucose, Bld: 106 mg/dL — ABNORMAL HIGH (ref 70–99)
Potassium: 3.6 mmol/L (ref 3.5–5.1)
Sodium: 137 mmol/L (ref 135–145)
Total Bilirubin: 1.2 mg/dL (ref 0.3–1.2)
Total Protein: 8.1 g/dL (ref 6.5–8.1)

## 2021-04-04 LAB — LIPASE, BLOOD: Lipase: <10 U/L — ABNORMAL LOW (ref 11–51)

## 2021-04-04 LAB — PREGNANCY, URINE: Preg Test, Ur: NEGATIVE

## 2021-04-04 MED ORDER — IOHEXOL 300 MG/ML  SOLN
100.0000 mL | Freq: Once | INTRAMUSCULAR | Status: AC | PRN
  Administered 2021-04-04: 85 mL via INTRAVENOUS

## 2021-04-04 MED ORDER — SODIUM CHLORIDE 0.9 % IV BOLUS
1000.0000 mL | Freq: Once | INTRAVENOUS | Status: AC
  Administered 2021-04-04: 1000 mL via INTRAVENOUS

## 2021-04-04 MED ORDER — SUCRALFATE 1 G PO TABS: 1.0000 g | ORAL_TABLET | Freq: Three times a day (TID) | ORAL | 0 refills | Status: DC

## 2021-04-04 MED ORDER — HALOPERIDOL LACTATE 5 MG/ML IJ SOLN
2.5000 mg | Freq: Once | INTRAMUSCULAR | Status: AC
  Administered 2021-04-04: 2.5 mg via INTRAVENOUS
  Filled 2021-04-04: qty 1

## 2021-04-04 MED ORDER — DIPHENHYDRAMINE HCL 50 MG/ML IJ SOLN
25.0000 mg | Freq: Once | INTRAMUSCULAR | Status: AC
  Administered 2021-04-04: 25 mg via INTRAVENOUS
  Filled 2021-04-04: qty 1

## 2021-04-04 MED ORDER — LIDOCAINE VISCOUS HCL 2 % MT SOLN
15.0000 mL | Freq: Once | OROMUCOSAL | Status: AC
  Administered 2021-04-04: 15 mL via ORAL
  Filled 2021-04-04: qty 15

## 2021-04-04 MED ORDER — FAMOTIDINE IN NACL 20-0.9 MG/50ML-% IV SOLN
20.0000 mg | Freq: Once | INTRAVENOUS | Status: AC
  Administered 2021-04-04: 20 mg via INTRAVENOUS
  Filled 2021-04-04: qty 50

## 2021-04-04 MED ORDER — CEPHALEXIN 500 MG PO CAPS
500.0000 mg | ORAL_CAPSULE | Freq: Two times a day (BID) | ORAL | 0 refills | Status: AC
  Filled 2021-04-04: qty 14, 7d supply, fill #0

## 2021-04-04 MED ORDER — ALUM & MAG HYDROXIDE-SIMETH 200-200-20 MG/5ML PO SUSP
30.0000 mL | Freq: Once | ORAL | Status: AC
  Administered 2021-04-04: 30 mL via ORAL
  Filled 2021-04-04: qty 30

## 2021-04-04 MED ORDER — ONDANSETRON HCL 4 MG PO TABS
4.0000 mg | ORAL_TABLET | Freq: Four times a day (QID) | ORAL | 0 refills | Status: AC
  Filled 2021-04-04: qty 10, 3d supply, fill #0

## 2021-04-04 MED ORDER — SUCRALFATE 1 G PO TABS
1.0000 g | ORAL_TABLET | Freq: Three times a day (TID) | ORAL | 0 refills | Status: AC
  Filled 2021-04-04: qty 40, 10d supply, fill #0

## 2021-04-04 MED ORDER — OMEPRAZOLE 20 MG PO CPDR
20.0000 mg | DELAYED_RELEASE_CAPSULE | Freq: Every day | ORAL | 0 refills | Status: AC
  Filled 2021-04-04: qty 30, 30d supply, fill #0

## 2021-04-04 NOTE — ED Notes (Signed)
Patient transported to CT 

## 2021-04-04 NOTE — ED Notes (Signed)
Patient verbalizes understanding of discharge instructions. Opportunity for questioning and answers were provided. Patient discharged from ED.  °

## 2021-04-04 NOTE — ED Notes (Signed)
Pt given UA cup in triage and sent to bathroom  ?

## 2021-04-04 NOTE — Discharge Instructions (Addendum)
It was a pleasure taking care of you today! ? ?Your labs are unremarkable.  Your CT scan did not show any abnormalities.  Your urine showed UTI, you will be treated with the antibiotic Keflex, ensure to complete the entire course of the antibiotic.  You will be sent a prescription for Prilosec, Carafate, Zofran.  Ensure to take the medications as prescribed.  Make sure that you call [facility] today to set up a follow-up appointment regarding today's ED visit.  Ensure to maintain fluid intake with small sips of tea, broth, soup, Pedialyte, Gatorade, water.  You may follow-up with your primary care provider as needed.  Return to the emergency department if experiencing increasing/worsening abdominal pain, fever, vomiting, worsening symptoms. ?

## 2021-04-04 NOTE — ED Provider Notes (Signed)
[facility] EMERGENCY DEPT Provider Note   CSN: [id number] Arrival date & time: 04/04/21  0915     History  Chief Complaint  Patient presents with   Abdominal Pain   Emesis   Nausea    Diane Kent is a 31 y.o. female.  Gallbladder removed in 2017.    Still have appendix.   Diane Kent is a 31 y.o. female  who presents to the Emergency Department complaining of upper abdominal pain onset 12:45 AM.  She notes that she has sick contacts of her daughter with emesis at home. Pt reports associated nausea, vomiting, diarrhea.  She notes that she has had nonbloody emesis and diarrhea x5 episodes each.  Patient has been able to sip on fluids.  Denies any new medications or foods.  She has tried Pepto-Bismol with no relief for her symptoms. Patient is that she had an endoscopy completed 3 years ago through [facility] that was notable for GERD.  She notes that the GI doctor wanted to repeat the endoscopy however patient has not gotten around to schedule it.  She used to take Prilosec for heartburn however she now takes Phenergan.  Patient notes she smokes approximately 1 marijuana joint a day.  Denies allergies to medications.  Denies fever, chills, congestion, rhinorrhea, sore throat, chest pain, shortness of breath, cough, dysuria, hematuria, vaginal bleeding, vaginal discharge, constipation.  Denies concerns for STD at this time that she is sexually active with a female partner with intermittent unprotected intercourse.  Her last menstrual period was 3 weeks ago.   The history is provided by the patient. No language interpreter was used.  Abdominal Pain Pain location: mid above umbilicus. Pain quality: sharp   Pain radiates to:  Does not radiate Pain severity:  Moderate Onset quality:  Sudden Duration:  10 hours Timing:  Intermittent Progression:  Unchanged Chronicity:  New Context: alcohol use, eating (after eating pizza), recent sexual activity and sick contacts    Context: not medication withdrawal and not suspicious food intake   Associated symptoms: diarrhea, nausea and vomiting   Associated symptoms: no chest pain, no chills, no constipation, no cough, no dysuria, no fever, no hematuria, no shortness of breath, no sore throat, no vaginal bleeding and no vaginal discharge   Risk factors: no alcohol abuse, no NSAID use, not pregnant and no recent hospitalization   Emesis Associated symptoms: abdominal pain and diarrhea   Associated symptoms: no chills, no cough, no fever and no sore throat       Home Medications Prior to Admission medications   Medication Sig Start Date End Date Taking? Authorizing Provider  cephALEXin (KEFLEX) 500 MG capsule Take 1 capsule (500 mg total) by mouth 2 (two) times daily for 7 days. 04/04/21 04/11/21 Yes Evora Schechter A, PA-C  colestipol (COLESTID) 1 g tablet Take 2 g by mouth at bedtime. 03/21/16  Yes [provider]  omeprazole (PRILOSEC) 20 MG capsule Take 1 capsule (20 mg total) by mouth daily. 04/04/21 05/04/21 Yes Malayah Demuro A, PA-C  ondansetron (ZOFRAN) 4 MG tablet Take 1 tablet (4 mg total) by mouth every 6 (six) hours. 04/04/21  Yes Clem Wisenbaker A, PA-C  SIMPESSE 0.15-0.03 &0.01 MG tablet Take 1 tablet by mouth daily. 03/21/21  Yes [provider]  valACYclovir (VALTREX) 500 MG tablet Take 500 mg by mouth daily. 03/21/21  Yes [provider]  benzonatate (TESSALON) 100 MG capsule Take 1 capsule (100 mg total) by mouth 3 (three) times daily as  needed for cough. Patient not taking: Reported on 04/04/2021 06/18/16   Katy Apo, NP  chlorpheniramine-HYDROcodone [facility] PENNKINETIC ER) 10-8 MG/5ML SUER Take 5 mLs by mouth at bedtime as needed for cough. Patient not taking: Reported on 04/04/2021 06/18/16   Katy Apo, NP  sucralfate (CARAFATE) 1 g tablet Take 1 tablet (1 g total) by mouth 4 (four) times daily -  with meals and at bedtime. 04/04/21   Jendaya Gossett A, PA-C      Allergies     Latex and Tape    Review of Systems   Review of Systems  Constitutional:  Negative for chills and fever.  HENT:  Negative for congestion, rhinorrhea and sore throat.   Respiratory:  Negative for cough and shortness of breath.   Cardiovascular:  Negative for chest pain.  Gastrointestinal:  Positive for abdominal pain, diarrhea, nausea and vomiting. Negative for constipation.  Genitourinary:  Negative for dysuria, hematuria, vaginal bleeding and vaginal discharge.  All other systems reviewed and are negative.  Physical Exam Updated Vital Signs BP (!) 134/92    Pulse 72    Temp 98.7 F (37.1 C)    Resp 16    SpO2 100%  Physical Exam Vitals and nursing note reviewed.  Constitutional:      General: She is not in acute distress.    Appearance: She is not diaphoretic.  HENT:     Head: Normocephalic and atraumatic.     Mouth/Throat:     Pharynx: No oropharyngeal exudate.  Eyes:     General: No scleral icterus.    Conjunctiva/sclera: Conjunctivae normal.  Cardiovascular:     Rate and Rhythm: Normal rate and regular rhythm.     Pulses: Normal pulses.     Heart sounds: Normal heart sounds.  Pulmonary:     Effort: Pulmonary effort is normal. No respiratory distress.     Breath sounds: Normal breath sounds. No wheezing.  Abdominal:     General: Bowel sounds are normal.     Palpations: Abdomen is soft. There is no mass.     Tenderness: There is abdominal tenderness in the epigastric area, suprapubic area and left upper quadrant. There is no right CVA tenderness, left CVA tenderness, guarding or rebound.     Comments: Epigastric, left upper quadrant, suprapubic tenderness to palpation.  Musculoskeletal:        General: Normal range of motion.     Cervical back: Normal range of motion and neck supple.  Skin:    General: Skin is warm and dry.  Neurological:     Mental Status: She is alert.  Psychiatric:        Behavior: Behavior normal.    ED Results / Procedures / Treatments    Labs (all labs ordered are listed, but only abnormal results are displayed) Labs Reviewed  CBC WITH DIFFERENTIAL/PLATELET - Abnormal; Notable for the following components:      Result Value   Platelets 513 (*)    All other components within normal limits  COMPREHENSIVE METABOLIC PANEL - Abnormal; Notable for the following components:   CO2 21 (*)    Glucose, Bld 106 (*)    Calcium 8.8 (*)    All other components within normal limits  LIPASE, BLOOD - Abnormal; Notable for the following components:   Lipase <10 (*)    All other components within normal limits  URINALYSIS, ROUTINE W REFLEX MICROSCOPIC - Abnormal; Notable for the following components:   Specific Gravity, Urine 1.033 (*)  Hgb urine dipstick SMALL (*)    Protein, ur 30 (*)    Leukocytes,Ua MODERATE (*)    Bacteria, UA RARE (*)    All other components within normal limits  PREGNANCY, URINE    EKG None  Radiology CT ABDOMEN PELVIS W CONTRAST  Result Date: 04/04/2021 CLINICAL DATA:  31 year old female with nausea vomiting and abdominal pain after eating last night. EXAM: CT ABDOMEN AND PELVIS WITH CONTRAST TECHNIQUE: Multidetector CT imaging of the abdomen and pelvis was performed using the standard protocol following bolus administration of intravenous contrast. RADIATION DOSE REDUCTION: This exam was performed according to the departmental dose-optimization program which includes automated exposure control, adjustment of the mA and/or kV according to patient size and/or use of iterative reconstruction technique. CONTRAST:  45mL OMNIPAQUE IOHEXOL 300 MG/ML  SOLN COMPARISON:  CT Abdomen and Pelvis 02/05/2017, 01/10/2015. FINDINGS: Lower chest: Negative. Hepatobiliary: Chronically absent gallbladder.  Negative liver. Pancreas: Negative. Spleen: Negative. Adrenals/Urinary Tract: Normal adrenal glands. Kidneys appear stable and normal. No nephrolithiasis or pararenal inflammation. Decompressed ureters. Diminutive, unremarkable  bladder. Stomach/Bowel: No dilated large or small bowel. Normal appendix on series 2, image 62. Unremarkable stomach and duodenum. No free air, free fluid, mesenteric inflammation. Vascular/Lymphatic: Major vascular structures in the abdomen and pelvis appear patent and normal. No lymphadenopathy identified. Reproductive: Within normal limits. Other: No pelvic free fluid.  Incidental pelvic phleboliths. Musculoskeletal: Negative. IMPRESSION: Negative CT Abdomen and Pelvis.  Normal appendix. Electronically Signed   By: Genevie Ann M.D.   On: 04/04/2021 12:05    Procedures Procedures    Medications Ordered in ED Medications  alum & mag hydroxide-simeth (MAALOX/MYLANTA) 200-200-20 MG/5ML suspension 30 mL (30 mLs Oral Given 04/04/21 1057)    And  lidocaine (XYLOCAINE) 2 % viscous mouth solution 15 mL (15 mLs Oral Given 04/04/21 1057)  famotidine (PEPCID) IVPB 20 mg premix (0 mg Intravenous Stopped 04/04/21 1237)  sodium chloride 0.9 % bolus 1,000 mL (0 mLs Intravenous Stopped 04/04/21 1237)  haloperidol lactate (HALDOL) injection 2.5 mg (2.5 mg Intravenous Given 04/04/21 1049)  diphenhydrAMINE (BENADRYL) injection 25 mg (25 mg Intravenous Given 04/04/21 1048)  iohexol (OMNIPAQUE) 300 MG/ML solution 100 mL (85 mLs Intravenous Contrast Given 04/04/21 1153)    ED Course/ Medical Decision Making/ A&P Clinical Course as of 04/04/21 1242  Thu Apr 04, 2021  1214 Re-eval and improvement of symptoms. [SB]  1214 [facility].  [SB]    Clinical Course User Index [SB] Ying Blankenhorn A, PA-C                           Medical Decision Making Amount and/or Complexity of Data Reviewed Labs: ordered. Radiology: ordered.  Risk OTC drugs. Prescription drug management.   Patient presents to the emergency department with upper abdominal pain, nausea, vomiting, diarrhea onset at 12:45 AM.  Patient has a history of similar symptoms with previous ED visits.  Vital signs stable, patient afebrile.  On exam patient with epigastric,  left upper quadrant, suprapubic tenderness to palpation.  No CVA tenderness to palpation noted bilaterally.  Differential diagnosis includes pancreatitis, GERD, cholecystitis, appendicitis, acute cystitis, pyelonephritis, cannabinoid hyperemesis syndrome.  Labs:  I ordered, and personally interpreted labs.  The pertinent results include:  Lipase unremarkable at less than 10. CMP with slightly elevated glucose at 106 otherwise unremarkable. Negative pregnancy urine. CBC with elevated platelets of 513 otherwise unremarkable. Urinalysis with small amount of hemoglobin and large leukocytosis otherwise unremarkable.  Imaging: I ordered  imaging studies including CT abdomen pelvis with contrast I independently visualized and interpreted imaging which showed: Negative CT abdomen, normal appendix. I agree with the radiologist interpretation  Medications:  I ordered medication including GI cocktail, IV fluids, Haldol, Benadryl for symptom management. Reevaluation of the patient after these medicines and interventions, I reevaluated the patient and found that they have improved I have reviewed the patients home medicines and have made adjustments as needed  Disposition: Patient presentation suspicious for GERD.  Also suspicious for cannabinoid hyperemesis syndrome.  Also notable for acute cystitis.  Doubt pancreatitis, cholecystitis, appendicitis, pyelonephritis, nephrolithiasis at this time. After consideration of the diagnostic results and the patients response to treatment, I feel that the patient would benefit from Discharge home.  Will be sent prescription for Zofran, Prilosec, Carafate, Keflex.  Instructed patient to call her gastroenterologist at [facility] to set up a follow-up appointment regarding today's ED visit.  Supportive care measures and strict return precautions discussed with patient at bedside. Pt acknowledges and verbalizes understanding. Pt appears safe for discharge. Follow up as  indicated in discharge paperwork.   This chart was dictated using voice recognition software, Dragon. Despite the best efforts of this provider to proofread and correct errors, errors may still occur which can change documentation meaning.  Final Clinical Impression(s) / ED Diagnoses Final diagnoses:  Epigastric pain  Nausea and vomiting, unspecified vomiting type    Rx / DC Orders ED Discharge Orders          Ordered    omeprazole (PRILOSEC) 20 MG capsule  Daily        04/04/21 1227    sucralfate (CARAFATE) 1 g tablet  3 times daily with meals & bedtime,   Status:  Discontinued        04/04/21 1227    ondansetron (ZOFRAN) 4 MG tablet  Every 6 hours        04/04/21 1227    cephALEXin (KEFLEX) 500 MG capsule  2 times daily        04/04/21 1227    sucralfate (CARAFATE) 1 g tablet  3 times daily with meals & bedtime        04/04/21 1228              Tavaria Mackins A, PA-C 04/04/21 1248    Lacretia Leigh, MD 04/04/21 1450

## 2021-04-04 NOTE — ED Triage Notes (Signed)
Pt presents with mid abd cramping, N/V starting last night. Pt reports she ate pizza at 9pm last night, went to bed approx 10:30pm, woke up 1245 with these symptoms. Pt is unable to keep anything down.  ?

## 2021-05-28 ENCOUNTER — Emergency Department (HOSPITAL_BASED_OUTPATIENT_CLINIC_OR_DEPARTMENT_OTHER): Payer: 59

## 2021-05-28 ENCOUNTER — Emergency Department (HOSPITAL_BASED_OUTPATIENT_CLINIC_OR_DEPARTMENT_OTHER)
Admission: EM | Admit: 2021-05-28 | Discharge: 2021-05-29 | Disposition: A | Discharge status: Home / Self Care | Payer: 59 | Attending: Emergency Medicine | Admitting: Emergency Medicine

## 2021-05-28 ENCOUNTER — Other Ambulatory Visit: Payer: Self-pay

## 2021-05-28 ENCOUNTER — Encounter (HOSPITAL_BASED_OUTPATIENT_CLINIC_OR_DEPARTMENT_OTHER): Payer: Self-pay

## 2021-05-28 DIAGNOSIS — H53149 Visual discomfort, unspecified: Secondary | ICD-10-CM | POA: Insufficient documentation

## 2021-05-28 DIAGNOSIS — R519 Headache, unspecified: Secondary | ICD-10-CM | POA: Diagnosis not present

## 2021-05-28 DIAGNOSIS — Z91040 Latex allergy status: Secondary | ICD-10-CM | POA: Insufficient documentation

## 2021-05-28 DIAGNOSIS — F17210 Nicotine dependence, cigarettes, uncomplicated: Secondary | ICD-10-CM | POA: Insufficient documentation

## 2021-05-28 DIAGNOSIS — R200 Anesthesia of skin: Secondary | ICD-10-CM | POA: Insufficient documentation

## 2021-05-28 DIAGNOSIS — R002 Palpitations: Secondary | ICD-10-CM | POA: Diagnosis present

## 2021-05-28 DIAGNOSIS — R42 Dizziness and giddiness: Secondary | ICD-10-CM | POA: Insufficient documentation

## 2021-05-28 DIAGNOSIS — R202 Paresthesia of skin: Secondary | ICD-10-CM | POA: Insufficient documentation

## 2021-05-28 LAB — BASIC METABOLIC PANEL
Anion gap: 11 (ref 5–15)
BUN: 6 mg/dL (ref 6–20)
CO2: 24 mmol/L (ref 22–32)
Calcium: 9 mg/dL (ref 8.9–10.3)
Chloride: 102 mmol/L (ref 98–111)
Creatinine, Ser: 0.63 mg/dL (ref 0.44–1.00)
GFR, Estimated: >60 mL/min (ref >60)
Glucose, Bld: 83 mg/dL (ref 70–99)
Potassium: 3 mmol/L — ABNORMAL LOW (ref 3.5–5.1)
Sodium: 137 mmol/L (ref 135–145)

## 2021-05-28 LAB — CBC
HCT: 38.4 % (ref 36.0–46.0)
Hemoglobin: 12.9 g/dL (ref 12.0–15.0)
MCH: 28.9 pg (ref 26.0–34.0)
MCHC: 33.6 g/dL (ref 30.0–36.0)
MCV: 86.1 fL (ref 80.0–100.0)
Platelets: 494 10*3/uL — ABNORMAL HIGH (ref 150–400)
RBC: 4.46 MIL/uL (ref 3.87–5.11)
RDW: 14.6 % (ref 11.5–15.5)
WBC: 10.6 10*3/uL — ABNORMAL HIGH (ref 4.0–10.5)
nRBC: 0 % (ref 0.0–0.2)

## 2021-05-28 LAB — MAGNESIUM: Magnesium: 1.7 mg/dL (ref 1.7–2.4)

## 2021-05-28 LAB — CBG MONITORING, ED: Glucose-Capillary: 88 mg/dL (ref 70–99)

## 2021-05-28 LAB — HCG, SERUM, QUALITATIVE: Preg, Serum: NEGATIVE

## 2021-05-28 MED ORDER — POTASSIUM CHLORIDE CRYS ER 20 MEQ PO TBCR
40.0000 meq | EXTENDED_RELEASE_TABLET | Freq: Once | ORAL | Status: AC
  Administered 2021-05-28: 40 meq via ORAL
  Filled 2021-05-28: qty 2

## 2021-05-28 MED ORDER — METOCLOPRAMIDE HCL 5 MG/ML IJ SOLN
10.0000 mg | Freq: Once | INTRAMUSCULAR | Status: AC
Start: 2021-05-28 — End: 2021-05-28
  Administered 2021-05-28: 10 mg via INTRAVENOUS
  Filled 2021-05-28: qty 2

## 2021-05-28 MED ORDER — KETOROLAC TROMETHAMINE 15 MG/ML IJ SOLN
15.0000 mg | Freq: Once | INTRAMUSCULAR | Status: AC
  Administered 2021-05-28: 15 mg via INTRAVENOUS
  Filled 2021-05-28: qty 1

## 2021-05-28 MED ORDER — MAGNESIUM SULFATE 2 GM/50ML IV SOLN
2.0000 g | Freq: Once | INTRAVENOUS | Status: AC
  Administered 2021-05-28: 2 g via INTRAVENOUS
  Filled 2021-05-28: qty 50

## 2021-05-28 MED ORDER — KETOROLAC TROMETHAMINE 15 MG/ML IJ SOLN: 15.0000 mg | Freq: Once | INTRAMUSCULAR | Status: DC | PRN

## 2021-05-28 NOTE — ED Provider Notes (Signed)
Was called to evaluate this patient at triage due to complaint of left arm tingling in regards to potential code stroke.  Patient yesterday had a generalized headache, took a dose of Excedrin.  Following that she had what she describes as a tightness in her left upper extremity muscles associated with heart palpitations/fluttering.  She states that this resolved and went away.  The headache was resolved today however shortly prior to arrival she again developed "tightness" in the muscles of her left upper extremity associated with heart racing/fluttering and an anxious feeling.  At 1 point she felt like there was a tingling sensation in her hands but denies any numbness, weakness, facial droop, speech change, vision changes.  Currently she still feels as if her heart is racing/fluttering and that the muscles in her arms are tight but denies any decrease sensation/weakness.  Do not feel that this is TIA/CVA related, will not call code stroke. ?  Rozelle Logan, DO ?05/28/21 2201 ? ?

## 2021-05-28 NOTE — ED Triage Notes (Signed)
Patient here POV from Home. ? ?Endorses Headache yesterday. Patient took 1 dose of Excedrin and then felt an episode of Tachycardia. Patient awoke this AM and felt like the Headache had subsided somewhat. ? ?Approximately 1 hour PTA however she felt another Episode of Tachycardia associated with Left Arm Tingling. ? ?Mild Nausea. No Emesis. No Fevers.  ? ?NAD Noted during Triage. A&Ox4. GCS 15. Ambulatory. ?

## 2021-05-28 NOTE — ED Provider Notes (Signed)
? ?[facility] ?Provider Note: Georgena Spurling, MD, [place] ? ?CSN: [id number] ?MRN: [id number] ?ARRIVAL: 05/28/21 at 2014 ?ROOM: [id number] ? ? ?CHIEF COMPLAINT  ?Palpitations ? ? ?HISTORY OF PRESENT ILLNESS  ?05/28/21 11:05 PM ?Diane Kent is a 31 y.o. female who had a headache (with photophobia, but she has never been formally diagnosed with migraines) yesterday and she took 1 dose of Excedrin.  She subsequently felt an episode of palpitations, a sensation that her heart was racing, along with lightheadedness.  This resolved on its own.  This morning when she awakened the headache had improved but an hour later she had another episode of palpitations with tingling in her left arm.  She has had mild nausea with this but no vomiting or fever.  She did not take Excedrin today. ? ? ?Past Medical History:  ?Diagnosis Date  ? Anemia   ? Anxiety 2009  ? ON MEDS X 1 YEAR  ? Depression 2009  ? in HS, ok now  ? H/O bacterial infection   ? History of chlamydia   ? Infection 02/2011  ? UTI  ? Pneumonia   ? AS CHILD  ? Stomach disease 2009  ? NO MEDS CURRENTLY  ? Yeast infection   ? ? ?Past Surgical History:  ?Procedure Laterality Date  ? TONSILLECTOMY  2009  ? T&A  ? [place] EXTRACTION  2007  ? ? ?Family History  ?Problem Relation Age of Onset  ? Asthma Other   ? Lupus Maternal Aunt   ? Hypertension Paternal Aunt   ? Fibromyalgia Paternal Aunt   ? Mental illness Paternal Aunt   ? Mental illness Paternal Uncle   ? Hypertension Paternal Uncle   ? Arthritis Maternal Grandmother   ? Hyperlipidemia Maternal Grandmother   ? Hypertension Maternal Grandmother   ? Hyperlipidemia Paternal Grandmother   ? Hypertension Paternal Grandmother   ? Diabetes Paternal Grandfather   ? Hyperlipidemia Paternal Grandfather   ? Hypertension Paternal Grandfather   ? Birth defects Maternal Uncle   ? Other Neg Hx   ? ? ?Social History  ? ?Tobacco Use  ? Smoking status: Every Day  ?  Packs/day: 1.00  ?  Types: Cigarettes  ?  Last attempt to  quit: 06/28/2011  ?  Years since quitting: 9.9  ? Smokeless tobacco: Never  ?Substance Use Topics  ? Alcohol use: Yes  ?  Alcohol/week: 1.0 standard drink  ?  Types: 1 Standard drinks or equivalent per week  ?  Comment: social alcohol before pregnancy  ? Drug use: Yes  ?  Frequency: 7.0 times per week  ?  Types: Marijuana  ?  Comment: D/C'D WITH PREGNANCY  ? ? ?Prior to Admission medications   ?Medication Sig Start Date End Date Taking? Authorizing Provider  ?colestipol (COLESTID) 1 g tablet Take 2 g by mouth at bedtime. 03/21/16   [provider]  ?omeprazole (PRILOSEC) 20 MG capsule Take 1 capsule (20 mg total) by mouth daily. 04/04/21 05/04/21  Blue, Soijett A, PA-C  ?ondansetron (ZOFRAN) 4 MG tablet Take 1 tablet (4 mg total) by mouth every 6 (six) hours. 04/04/21   Blue, Soijett A, PA-C  ?SIMPESSE 0.15-0.03 &0.01 MG tablet Take 1 tablet by mouth daily. 03/21/21   [provider]  ?sucralfate (CARAFATE) 1 g tablet Take 1 tablet (1 g total) by mouth 4 (four) times daily -  with meals and at bedtime. 04/04/21   Blue, Soijett A, PA-C  ?valACYclovir (VALTREX) 500 MG tablet Take  500 mg by mouth daily. 03/21/21   [provider]  ? ? ?Allergies ?Latex and Tape ? ? ?REVIEW OF SYSTEMS  ?Negative except as noted here or in the History of Present Illness. ? ? ?PHYSICAL EXAMINATION  ?Initial Vital Signs ?Blood pressure (!) 164/97, pulse 75, temperature 98.9 ?F (37.2 ?C), resp. rate 18, height 5\' 2"  (1.575 m), weight 63.5 kg, SpO2 98 %. ? ?Examination ?General: Well-developed, well-nourished female in no acute distress; appearance consistent with age of record ?HENT: normocephalic; atraumatic ?Eyes: pupils equal, round and reactive to light; extraocular muscles intact ?Neck: supple ?Heart: regular rate and rhythm ?Lungs: clear to auscultation bilaterally ?Abdomen: soft; nondistended; nontender; bowel sounds present ?Extremities: No deformity; full range of motion; pulses normal ?Neurologic: Awake, alert and  oriented; motor function intact in all extremities and symmetric; no facial droop ?Skin: Warm and dry ?Psychiatric: Normal mood and affect ? ? ?RESULTS  ?Summary of this visit's results, reviewed and interpreted by myself: ? ? EKG Interpretation ? ?Date/Time:  Tuesday May 28 2021 20:38:16 EDT ?Ventricular Rate:  80 ?PR Interval:  140 ?QRS Duration: 78 ?QT Interval:  360 ?QTC Calculation: 415 ?R Axis:   60 ?Text Interpretation: Normal sinus rhythm Anterior infarct , age undetermined Abnormal ECG No previous ECGs available Confirmed by Wynn Kernes [id number]) on 05/28/2021 11:09:40 PM ?  ? ?  ? ?Laboratory Studies: ?Results for orders placed or performed during the hospital encounter of 05/28/21 (from the past 24 hour(s))  ?CBG monitoring, ED     Status: None  ? Collection Time: 05/28/21  8:48 PM  ?Result Value Ref Range  ? Glucose-Capillary 88 70 - 99 mg/dL  ?Basic metabolic panel     Status: Abnormal  ? Collection Time: 05/28/21  8:50 PM  ?Result Value Ref Range  ? Sodium 137 135 - 145 mmol/L  ? Potassium 3.0 (L) 3.5 - 5.1 mmol/L  ? Chloride 102 98 - 111 mmol/L  ? CO2 24 22 - 32 mmol/L  ? Glucose, Bld 83 70 - 99 mg/dL  ? BUN 6 6 - 20 mg/dL  ? Creatinine, Ser 0.63 0.44 - 1.00 mg/dL  ? Calcium 9.0 8.9 - 10.3 mg/dL  ? GFR, Estimated >60 >60 mL/min  ? Anion gap 11 5 - 15  ?CBC     Status: Abnormal  ? Collection Time: 05/28/21  8:50 PM  ?Result Value Ref Range  ? WBC 10.6 (H) 4.0 - 10.5 K/uL  ? RBC 4.46 3.87 - 5.11 MIL/uL  ? Hemoglobin 12.9 12.0 - 15.0 g/dL  ? HCT 38.4 36.0 - 46.0 %  ? MCV 86.1 80.0 - 100.0 fL  ? MCH 28.9 26.0 - 34.0 pg  ? MCHC 33.6 30.0 - 36.0 g/dL  ? RDW 14.6 11.5 - 15.5 %  ? Platelets 494 (H) 150 - 400 K/uL  ? nRBC 0.0 0.0 - 0.2 %  ?hCG, serum, qualitative     Status: None  ? Collection Time: 05/28/21  8:50 PM  ?Result Value Ref Range  ? Preg, Serum NEGATIVE NEGATIVE  ?Magnesium     Status: None  ? Collection Time: 05/28/21  8:50 PM  ?Result Value Ref Range  ? Magnesium 1.7 1.7 - 2.4 mg/dL  ?TSH      Status: None  ? Collection Time: 05/28/21 11:41 PM  ?Result Value Ref Range  ? TSH 0.750 0.350 - 4.500 uIU/mL  ? ?Imaging Studies: ?CT Head Wo Contrast ? ?Result Date: 05/28/2021 ?CLINICAL DATA:  Neuro deficit, acute, stroke suspected. Left  Arm Numbness. EXAM: CT HEAD WITHOUT CONTRAST TECHNIQUE: Contiguous axial images were obtained from the base of the skull through the vertex without intravenous contrast. RADIATION DOSE REDUCTION: This exam was performed according to the departmental dose-optimization program which includes automated exposure control, adjustment of the mA and/or kV according to patient size and/or use of iterative reconstruction technique. COMPARISON:  None. FINDINGS: Brain: Normal anatomic configuration. No abnormal intra or extra-axial mass lesion or fluid collection. No abnormal mass effect or midline shift. No evidence of acute intracranial hemorrhage or infarct. Ventricular size is normal. Cerebellum unremarkable. Vascular: Unremarkable Skull: Intact Sinuses/Orbits: Dense opacification of the right sphenoid sinus. Remaining paranasal sinuses are clear. Orbits are unremarkable. Other: Mastoid air cells and middle ear cavities are clear. IMPRESSION: No acute abnormality. Right sphenoid sinusitis Electronically Signed   By: Fidela Salisbury M.D.   On: 05/28/2021 21:13   ? ?ED COURSE and MDM  ?Nursing notes, initial and subsequent vitals signs, including pulse oximetry, reviewed and interpreted by myself. ? ?Vitals:  ? 05/28/21 2034 05/28/21 2240 05/28/21 2330 05/29/21 0045  ?BP:  (!) 164/97 (!) 154/93 (!) 141/88  ?Pulse:  75 69 71  ?Resp:  18 (!) 23 (!) 22  ?Temp:      ?SpO2:  98% 97% 98%  ?Weight: 63.5 kg     ?Height: 5\' 2"  (1.575 m)     ? ?Medications  ?potassium chloride SA (KLOR-CON M) CR tablet 40 mEq (40 mEq Oral Given 05/28/21 2330)  ?magnesium sulfate IVPB 2 g 50 mL (0 g Intravenous Stopped 05/29/21 0042)  ?metoCLOPramide (REGLAN) injection 10 mg (10 mg Intravenous Given 05/28/21 2330)   ?ketorolac (TORADOL) 15 MG/ML injection 15 mg (15 mg Intravenous Given 05/28/21 2330)  ? ?1:06 AM ?Rhythm strip reviewed for patient's entire time in ED.  No tachyarrhythmias or ectopy seen.  Headache resolved af

## 2021-05-28 NOTE — ED Notes (Signed)
MD Horton consulted regarding Patient Symptoms. New and Appropriate Orders Placed according to MD Physical Assessment. Patient to be brought into Examination Room when available.  ?

## 2021-05-29 LAB — TSH: TSH: 0.75 u[IU]/mL (ref 0.350–4.500)

## 2021-05-29 NOTE — ED Notes (Signed)
Pt verbalizes understanding of discharge instructions. Opportunity for questioning and answers were provided. Pt discharged from ED to home with family.    

## 2022-01-31 ENCOUNTER — Ambulatory Visit (HOSPITAL_COMMUNITY): Payer: 59

## 2022-01-31 ENCOUNTER — Encounter (HOSPITAL_COMMUNITY): Payer: Self-pay | Admitting: Emergency Medicine

## 2022-01-31 ENCOUNTER — Ambulatory Visit (HOSPITAL_COMMUNITY)
Admission: EM | Admit: 2022-01-31 | Discharge: 2022-01-31 | Disposition: A | Discharge status: Home / Self Care | Payer: 59 | Attending: Emergency Medicine | Admitting: Emergency Medicine

## 2022-01-31 DIAGNOSIS — J9801 Acute bronchospasm: Secondary | ICD-10-CM | POA: Diagnosis not present

## 2022-01-31 DIAGNOSIS — J45909 Unspecified asthma, uncomplicated: Secondary | ICD-10-CM

## 2022-01-31 MED ORDER — ALBUTEROL SULFATE HFA 108 (90 BASE) MCG/ACT IN AERS
2.0000 | INHALATION_SPRAY | Freq: Once | RESPIRATORY_TRACT | Status: AC
  Administered 2022-01-31: 2 via RESPIRATORY_TRACT

## 2022-01-31 MED ORDER — GUAIFENESIN ER 600 MG PO TB12: 600.0000 mg | ORAL_TABLET | Freq: Two times a day (BID) | ORAL | 0 refills | Status: AC

## 2022-01-31 MED ORDER — PREDNISONE 20 MG PO TABS: 40.0000 mg | ORAL_TABLET | Freq: Every day | ORAL | 0 refills | Status: AC

## 2022-01-31 MED ORDER — ALBUTEROL SULFATE HFA 108 (90 BASE) MCG/ACT IN AERS
INHALATION_SPRAY | RESPIRATORY_TRACT | Status: AC
  Filled 2022-01-31: qty 6.7

## 2022-01-31 NOTE — Discharge Instructions (Addendum)
I recommend mucinex twice daily for congestion and cough. Take with lots of fluids! You can also take benadryl at night.  Please use the inhaler every 6 hours for the next 2-3 days, then continue as needed.  Take the prednisone as prescribed.

## 2022-01-31 NOTE — ED Provider Notes (Signed)
[facility]    CSN: [id number] Arrival date & time: 01/31/22  1638      History   Chief Complaint Chief Complaint  Patient presents with   Cough    I have been taking cold medicine and can't get rid of this cough. - Entered by patient    HPI Diane Kent is a 31 y.o. female.  Presents with cough and nasal congestion Over the last 2 weeks she has had cough on and off.  It resolved with DayQuil and Mucinex.  Over the last several days reports cough has come back but does not get better with over-the-counter medicine Feels it is worse at night No fevers.  Denies wheezing or shortness of breath No known sick contacts  Past Medical History:  Diagnosis Date   Anemia    Anxiety 2009   ON MEDS X 1 YEAR   Depression 2009   in HS, ok now   H/O bacterial infection    History of chlamydia    Infection 02/2011   UTI   Pneumonia    AS CHILD   Stomach disease 2009   NO MEDS CURRENTLY   Yeast infection     Patient Active Problem List   Diagnosis Date Noted   Anemia 01/17/2012    Past Surgical History:  Procedure Laterality Date   TONSILLECTOMY  2009   T&A   WISDOM TOOTH EXTRACTION  2007    OB History     Gravida  1   Para  1   Term  1   Preterm  0   AB  0   Living  1      SAB  0   IAB  0   Ectopic  0   Multiple  0   Live Births  1            Home Medications    Prior to Admission medications   Medication Sig Start Date End Date Taking? Authorizing Provider  guaiFENesin (MUCINEX) 600 MG 12 hr tablet Take 1 tablet (600 mg total) by mouth 2 (two) times daily for 5 days. 01/31/22 02/05/22 Yes Zahira Brummond, Lurena Joiner, PA-C  predniSONE (DELTASONE) 20 MG tablet Take 2 tablets (40 mg total) by mouth daily for 5 days. 01/31/22 02/05/22 Yes Corry Storie, Lurena Joiner, PA-C  colestipol (COLESTID) 1 g tablet Take 2 g by mouth at bedtime. 03/21/16   [provider]  omeprazole (PRILOSEC) 20 MG capsule Take 1 capsule (20 mg total) by mouth daily.  04/04/21 05/04/21  Blue, Soijett A, PA-C  ondansetron (ZOFRAN) 4 MG tablet Take 1 tablet (4 mg total) by mouth every 6 (six) hours. 04/04/21   Blue, Soijett A, PA-C  SIMPESSE 0.15-0.03 &0.01 MG tablet Take 1 tablet by mouth daily. 03/21/21   [provider]  sucralfate (CARAFATE) 1 g tablet Take 1 tablet (1 g total) by mouth 4 (four) times daily -  with meals and at bedtime. 04/04/21   Blue, Soijett A, PA-C  valACYclovir (VALTREX) 500 MG tablet Take 500 mg by mouth daily. 03/21/21   [provider]    Family History Family History  Problem Relation Age of Onset   Asthma Other    Lupus Maternal Aunt    Hypertension Paternal Aunt    Fibromyalgia Paternal Aunt    Mental illness Paternal Aunt    Mental illness Paternal Uncle    Hypertension Paternal Uncle    Arthritis Maternal Grandmother    Hyperlipidemia Maternal Grandmother  Hypertension Maternal Grandmother    Hyperlipidemia Paternal Grandmother    Hypertension Paternal Grandmother    Diabetes Paternal Grandfather    Hyperlipidemia Paternal Grandfather    Hypertension Paternal Grandfather    Birth defects Maternal Uncle    Other Neg Hx     Social History Social History   Tobacco Use   Smoking status: Every Day    Packs/day: 1.00    Types: Cigarettes    Last attempt to quit: 06/28/2011    Years since quitting: 10.6   Smokeless tobacco: Never  Substance Use Topics   Alcohol use: Yes    Alcohol/week: 1.0 standard drink of alcohol    Types: 1 Standard drinks or equivalent per week    Comment: social alcohol before pregnancy   Drug use: Yes    Frequency: 7.0 times per week    Types: Marijuana    Comment: D/C'D WITH PREGNANCY     Allergies   Latex and Tape   Review of Systems Review of Systems  Respiratory:  Positive for cough.    As per HPI  Physical Exam Triage Vital Signs ED Triage Vitals  Enc Vitals Group     BP 01/31/22 1744 (!) 135/91     Pulse Rate 01/31/22 1744 95     Resp 01/31/22 1744 16      Temp 01/31/22 1744 98.5 F (36.9 C)     Temp Source 01/31/22 1744 Oral     SpO2 01/31/22 1744 98 %     Weight --      Height --      Head Circumference --      Peak Flow --      Pain Score 01/31/22 1743 0     Pain Loc --      Pain Edu? --      Excl. in GC? --    No data found.  Updated Vital Signs BP (!) 135/91 (BP Location: Left Arm)   Pulse 95   Temp 98.5 F (36.9 C) (Oral)   Resp 16   SpO2 98%    Physical Exam Vitals and nursing note reviewed.  HENT:     Nose: No congestion or rhinorrhea.     Mouth/Throat:     Mouth: Mucous membranes are moist.     Pharynx: Oropharynx is clear. No posterior oropharyngeal erythema.  Eyes:     Conjunctiva/sclera: Conjunctivae normal.  Cardiovascular:     Rate and Rhythm: Normal rate and regular rhythm.     Pulses: Normal pulses.     Heart sounds: Normal heart sounds.  Pulmonary:     Effort: Pulmonary effort is normal.     Breath sounds: Normal breath sounds.     Comments: Faint wheezing with dry reactive cough Musculoskeletal:     Cervical back: Normal range of motion.  Lymphadenopathy:     Cervical: No cervical adenopathy.  Skin:    General: Skin is warm and dry.  Neurological:     Mental Status: She is alert and oriented to person, place, and time.     UC Treatments / Results  Labs (all labs ordered are listed, but only abnormal results are displayed) Labs Reviewed - No data to display  EKG   Radiology No results found.  Procedures Procedures (including critical care time)  Medications Ordered in UC Medications  albuterol (VENTOLIN HFA) 108 (90 Base) MCG/ACT inhaler 2 puff (2 puffs Inhalation Given 01/31/22 1843)    Initial Impression / Assessment and Plan / UC Course  I have reviewed the triage vital signs and the nursing notes.  Pertinent labs & imaging results that were available during my care of the patient were reviewed by me and considered in my medical decision making (see chart for  details).  Well appearing, afebrile  Consider reactive airway disease 2 puffs inhaler given in clinic, recommend continue this over the next several days, every 6 hours. Cough was reactive to taking a deep breath and she had some faint wheezing.  Prednisone short course.  Recommend continue the Mucinex with lots of fluids. Return precautions discussed. Patient agrees to plan  Final Clinical Impressions(s) / UC Diagnoses   Final diagnoses:  Mild reactive airways disease, unspecified whether persistent     Discharge Instructions      I recommend mucinex twice daily for congestion and cough. Take with lots of fluids! You can also take benadryl at night.  Please use the inhaler every 6 hours for the next 2-3 days, then continue as needed.  Take the prednisone as prescribed.    ED Prescriptions     Medication Sig Dispense Auth. Provider   predniSONE (DELTASONE) 20 MG tablet Take 2 tablets (40 mg total) by mouth daily for 5 days. 10 tablet Iridessa Harrow, PA-C   guaiFENesin (MUCINEX) 600 MG 12 hr tablet Take 1 tablet (600 mg total) by mouth 2 (two) times daily for 5 days. 10 tablet Kristie Bracewell, Lurena Joiner, PA-C      PDMP not reviewed this encounter.   Javad Salva, Ray Church 01/31/22 2148

## 2022-01-31 NOTE — ED Triage Notes (Signed)
Pt reports a cough and nasal congestion. States she was sick 3 weeks ago and was taking Dayquil for 4/5 days with no relief. Started taking Mucinex for 4/5 days with no relief and 2 days ago tried an OTC cold medication. States the cough is worse at night and wakes her up from sleep.

## 2022-12-09 ENCOUNTER — Other Ambulatory Visit (HOSPITAL_COMMUNITY): Payer: Self-pay
